# Patient Record
Sex: Female | Born: 1980 | Race: White | Hispanic: Yes | Smoking: Never smoker
Health system: Southern US, Community
[De-identification: ages and names within clinical notes are randomized; demographics above are authoritative.]

## PROBLEM LIST (undated history)

## (undated) DIAGNOSIS — O1490 Unspecified pre-eclampsia, unspecified trimester: Secondary | ICD-10-CM

## (undated) HISTORY — DX: Unspecified pre-eclampsia, unspecified trimester: O14.90

---

## 2005-01-13 ENCOUNTER — Inpatient Hospital Stay (HOSPITAL_COMMUNITY): Admission: AD | Admit: 2005-01-13 | Discharge: 2005-01-13 | Payer: Self-pay | Admitting: *Deleted

## 2005-02-20 ENCOUNTER — Inpatient Hospital Stay (HOSPITAL_COMMUNITY): Admission: AD | Admit: 2005-02-20 | Discharge: 2005-02-20 | Payer: Self-pay | Admitting: Obstetrics & Gynecology

## 2005-04-08 ENCOUNTER — Ambulatory Visit (HOSPITAL_COMMUNITY): Admission: RE | Admit: 2005-04-08 | Discharge: 2005-04-08 | Payer: Self-pay | Admitting: *Deleted

## 2005-06-09 ENCOUNTER — Inpatient Hospital Stay (HOSPITAL_COMMUNITY): Admission: AD | Admit: 2005-06-09 | Discharge: 2005-06-11 | Payer: Self-pay | Admitting: *Deleted

## 2005-06-09 ENCOUNTER — Ambulatory Visit: Payer: Self-pay | Admitting: *Deleted

## 2005-06-19 ENCOUNTER — Ambulatory Visit: Payer: Self-pay | Admitting: Family Medicine

## 2005-07-03 ENCOUNTER — Ambulatory Visit: Payer: Self-pay | Admitting: Family Medicine

## 2005-07-17 ENCOUNTER — Ambulatory Visit: Payer: Self-pay | Admitting: *Deleted

## 2005-07-22 ENCOUNTER — Ambulatory Visit: Payer: Self-pay | Admitting: *Deleted

## 2005-07-29 ENCOUNTER — Ambulatory Visit: Payer: Self-pay | Admitting: *Deleted

## 2005-08-04 DIAGNOSIS — O1490 Unspecified pre-eclampsia, unspecified trimester: Secondary | ICD-10-CM

## 2005-08-04 HISTORY — DX: Unspecified pre-eclampsia, unspecified trimester: O14.90

## 2005-08-05 ENCOUNTER — Ambulatory Visit: Payer: Self-pay | Admitting: *Deleted

## 2005-08-12 ENCOUNTER — Ambulatory Visit: Payer: Self-pay | Admitting: Family Medicine

## 2005-08-14 ENCOUNTER — Ambulatory Visit (HOSPITAL_COMMUNITY): Admission: RE | Admit: 2005-08-14 | Discharge: 2005-08-14 | Payer: Self-pay | Admitting: Obstetrics and Gynecology

## 2005-08-19 ENCOUNTER — Inpatient Hospital Stay (HOSPITAL_COMMUNITY): Admission: AD | Admit: 2005-08-19 | Discharge: 2005-08-22 | Payer: Self-pay | Admitting: *Deleted

## 2005-08-19 ENCOUNTER — Ambulatory Visit: Payer: Self-pay | Admitting: *Deleted

## 2005-08-19 ENCOUNTER — Ambulatory Visit: Payer: Self-pay | Admitting: Family Medicine

## 2007-09-02 ENCOUNTER — Telehealth (INDEPENDENT_AMBULATORY_CARE_PROVIDER_SITE_OTHER): Payer: Self-pay | Admitting: *Deleted

## 2008-02-07 ENCOUNTER — Ambulatory Visit: Payer: Self-pay | Admitting: Internal Medicine

## 2008-02-09 ENCOUNTER — Ambulatory Visit: Payer: Self-pay | Admitting: *Deleted

## 2009-04-18 ENCOUNTER — Ambulatory Visit: Payer: Self-pay | Admitting: Obstetrics and Gynecology

## 2009-04-18 LAB — CONVERTED CEMR LAB
Hepatitis B Surface Ag: NEGATIVE
Hgb A2 Quant: 2.9 % (ref 2.2–3.2)
Hgb A: 97.1 % (ref 96.8–97.8)
Hgb F Quant: 0 % (ref 0.0–2.0)
Lymphocytes Relative: 19 % (ref 12–46)
Lymphs Abs: 1.9 10*3/uL (ref 0.7–4.0)
MCV: 89.7 fL (ref 78.0–100.0)
Monocytes Relative: 6 % (ref 3–12)
Neutro Abs: 7.3 10*3/uL (ref 1.7–7.7)
Neutrophils Relative %: 74 % (ref 43–77)
RBC: 4.65 M/uL (ref 3.87–5.11)
Rubella: 51.1 intl units/mL — ABNORMAL HIGH
WBC: 9.9 10*3/uL (ref 4.0–10.5)

## 2009-04-19 ENCOUNTER — Encounter: Payer: Self-pay | Admitting: Obstetrics and Gynecology

## 2009-04-24 ENCOUNTER — Ambulatory Visit (HOSPITAL_COMMUNITY): Admission: RE | Admit: 2009-04-24 | Discharge: 2009-04-24 | Payer: Self-pay | Admitting: Obstetrics & Gynecology

## 2009-05-16 ENCOUNTER — Ambulatory Visit: Payer: Self-pay | Admitting: Obstetrics and Gynecology

## 2009-06-13 ENCOUNTER — Ambulatory Visit: Payer: Self-pay | Admitting: Obstetrics & Gynecology

## 2009-06-15 ENCOUNTER — Ambulatory Visit (HOSPITAL_COMMUNITY): Admission: RE | Admit: 2009-06-15 | Discharge: 2009-06-15 | Payer: Self-pay | Admitting: Family Medicine

## 2009-07-11 ENCOUNTER — Ambulatory Visit: Payer: Self-pay | Admitting: Obstetrics and Gynecology

## 2009-08-08 ENCOUNTER — Ambulatory Visit: Payer: Self-pay | Admitting: Obstetrics and Gynecology

## 2009-08-22 ENCOUNTER — Encounter: Payer: Self-pay | Admitting: Family

## 2009-08-22 ENCOUNTER — Ambulatory Visit: Payer: Self-pay | Admitting: Obstetrics and Gynecology

## 2009-08-22 LAB — CONVERTED CEMR LAB
HCT: 39 % (ref 36.0–46.0)
Hemoglobin: 12.9 g/dL (ref 12.0–15.0)
MCHC: 33.1 g/dL (ref 30.0–36.0)
Platelets: 294 10*3/uL (ref 150–400)
RBC: 4.36 M/uL (ref 3.87–5.11)
RDW: 13.5 % (ref 11.5–15.5)
WBC: 8.8 10*3/uL (ref 4.0–10.5)

## 2009-09-05 ENCOUNTER — Ambulatory Visit: Payer: Self-pay | Admitting: Obstetrics and Gynecology

## 2009-09-19 ENCOUNTER — Encounter: Payer: Self-pay | Admitting: Advanced Practice Midwife

## 2009-09-19 ENCOUNTER — Ambulatory Visit: Payer: Self-pay | Admitting: Obstetrics and Gynecology

## 2009-09-19 ENCOUNTER — Encounter: Payer: Self-pay | Admitting: Family Medicine

## 2009-09-26 ENCOUNTER — Ambulatory Visit: Payer: Self-pay | Admitting: Obstetrics and Gynecology

## 2009-10-10 ENCOUNTER — Ambulatory Visit: Payer: Self-pay | Admitting: Obstetrics & Gynecology

## 2009-10-17 ENCOUNTER — Ambulatory Visit: Payer: Self-pay | Admitting: Obstetrics and Gynecology

## 2009-10-17 LAB — CONVERTED CEMR LAB
Chlamydia, DNA Probe: NEGATIVE
GC Probe Amp, Genital: NEGATIVE

## 2009-10-18 ENCOUNTER — Encounter: Payer: Self-pay | Admitting: Obstetrics and Gynecology

## 2009-10-18 LAB — CONVERTED CEMR LAB
Clue Cells Wet Prep HPF POC: NONE SEEN
Trich, Wet Prep: NONE SEEN
Yeast Wet Prep HPF POC: NONE SEEN

## 2009-10-24 ENCOUNTER — Ambulatory Visit: Payer: Self-pay | Admitting: Obstetrics and Gynecology

## 2009-10-30 ENCOUNTER — Inpatient Hospital Stay (HOSPITAL_COMMUNITY): Admission: AD | Admit: 2009-10-30 | Discharge: 2009-11-01 | Payer: Self-pay | Admitting: Obstetrics & Gynecology

## 2009-10-30 ENCOUNTER — Ambulatory Visit: Payer: Self-pay | Admitting: Family

## 2009-12-13 ENCOUNTER — Ambulatory Visit: Payer: Self-pay | Admitting: Obstetrics & Gynecology

## 2009-12-13 LAB — CONVERTED CEMR LAB: Pap Smear: NEGATIVE

## 2009-12-25 IMAGING — US US OB COMP LESS 14 WK
1 series · 14 of 28 positions shown · non-contrast
Comparison: none

OBSTETRICAL ULTRASOUND:
 This ultrasound exam was performed in the [HOSPITAL] Ultrasound Department.  The OB US report was generated in the AS system, and faxed to the ordering physician.  This report is also available in [REDACTED] PACS.

[Series 1: us ob comp less 14 wks · 31 acquisitions, 14 frames shown]
[im 2/31]
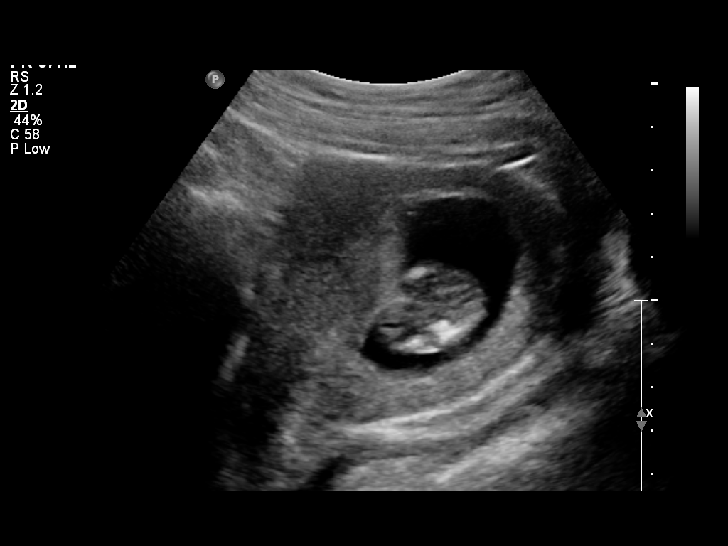
[im 4/31]
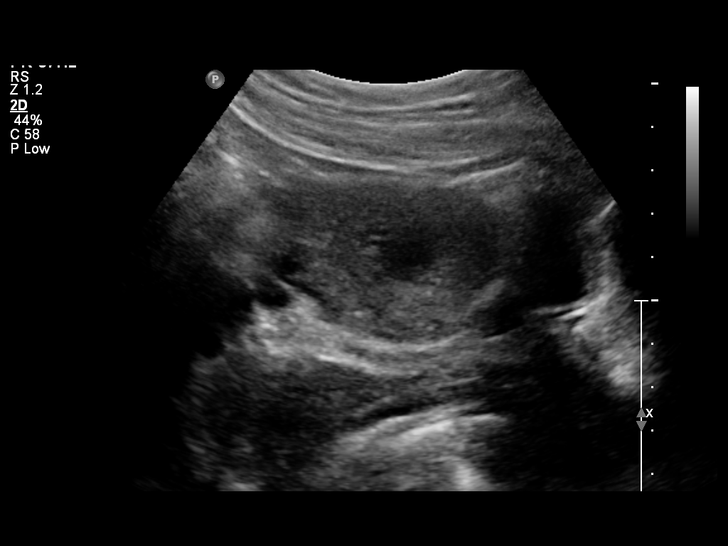
[im 6/31]
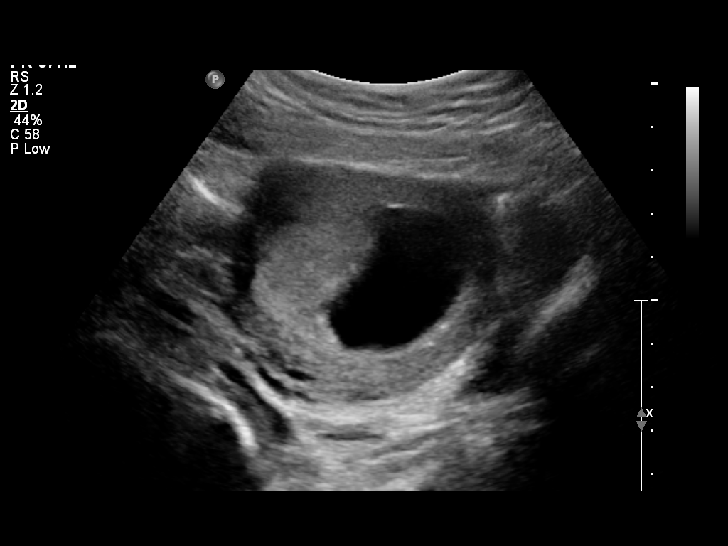
[im 8/31]
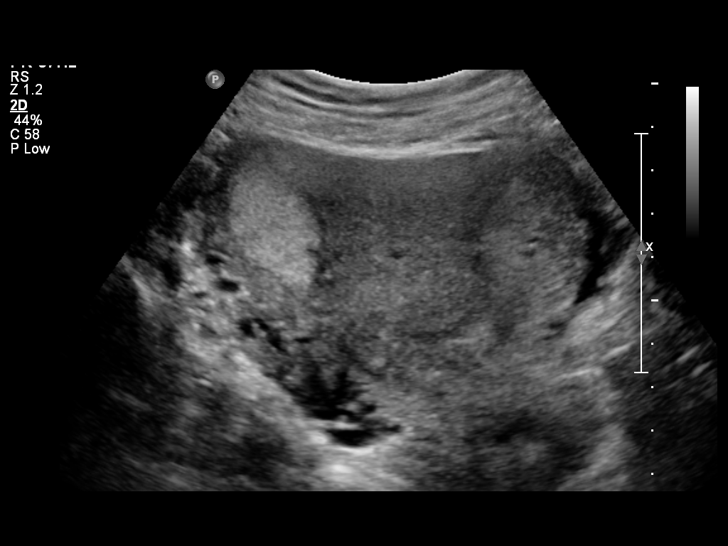
[im 11/31]
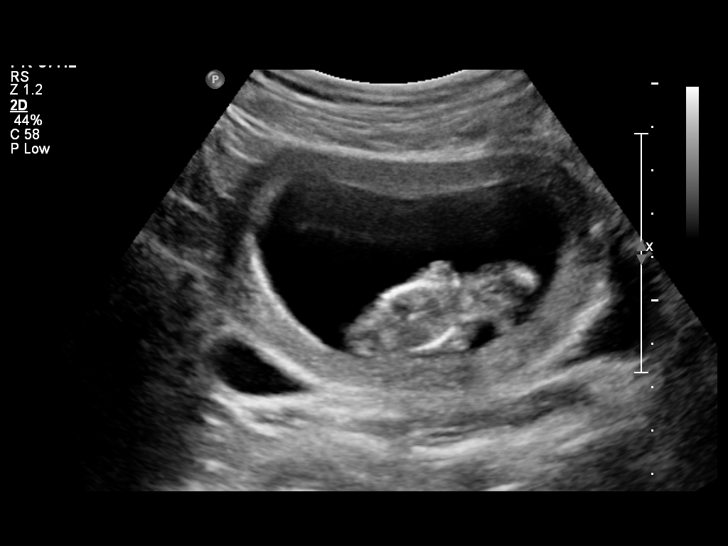
[im 13/31]
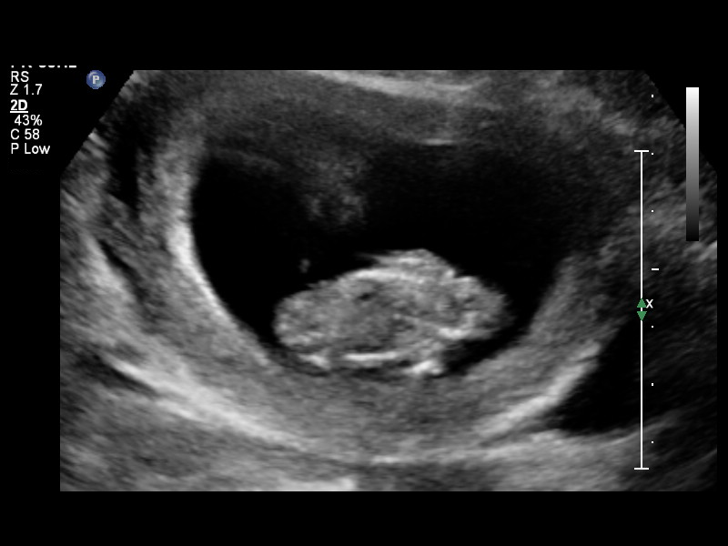
[im 15/31]
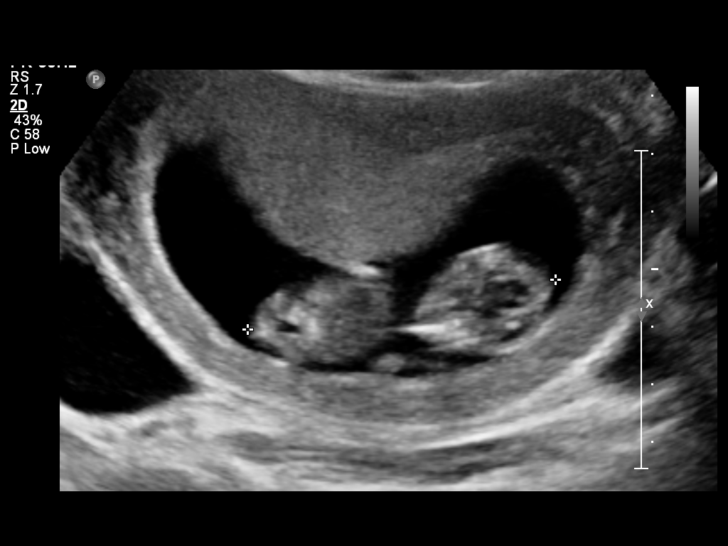
[im 17/31]
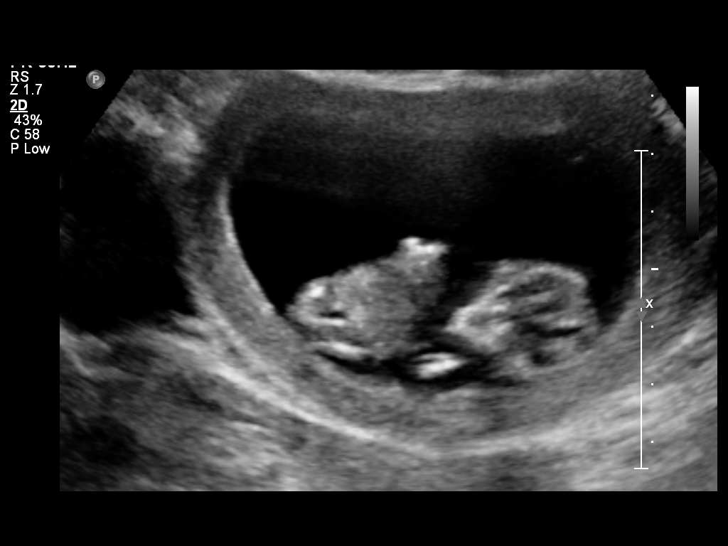
[im 19/31]
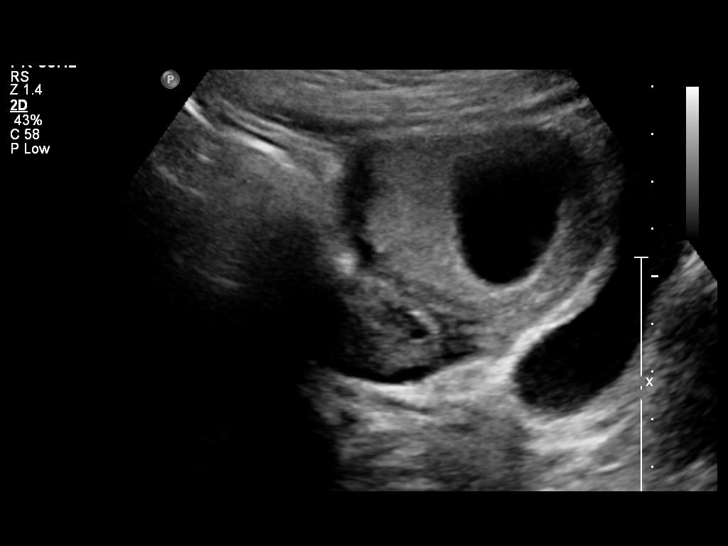
[im 22/31]
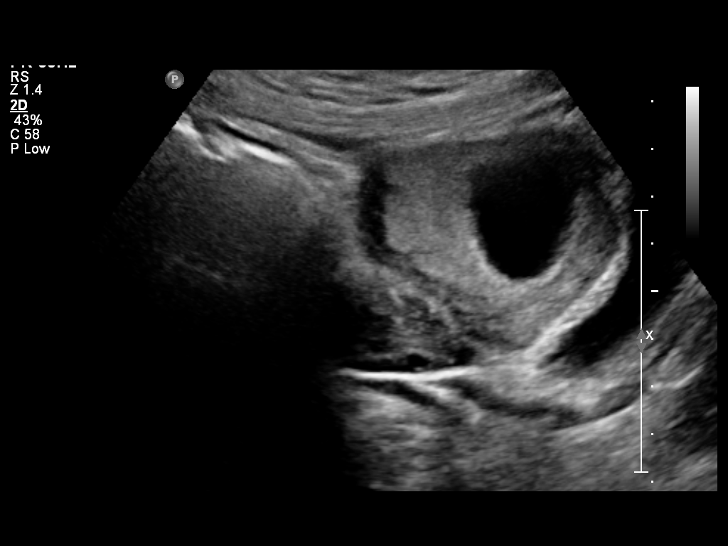
[im 24/31]
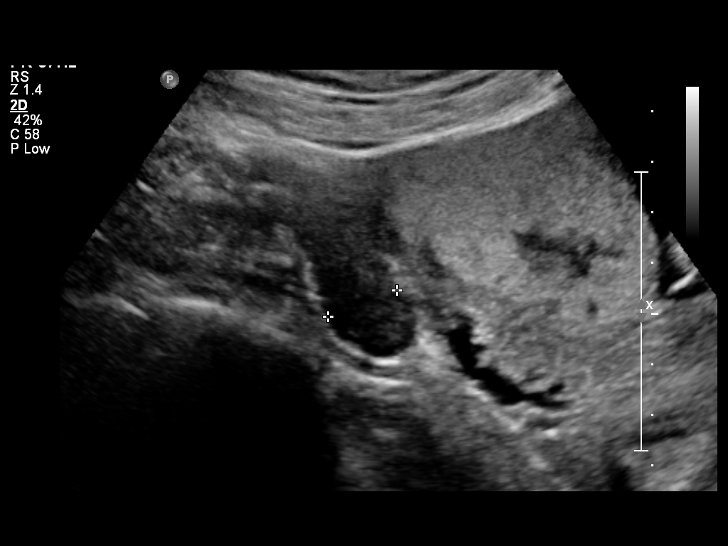
[im 26/31]
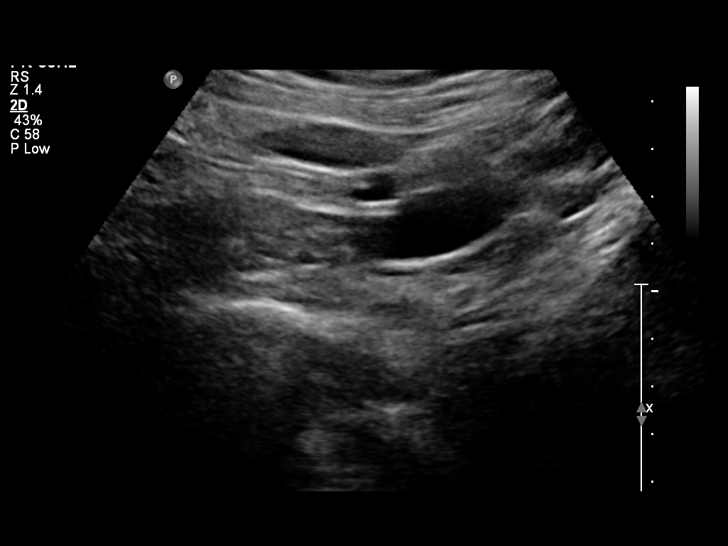
[im 28/31]
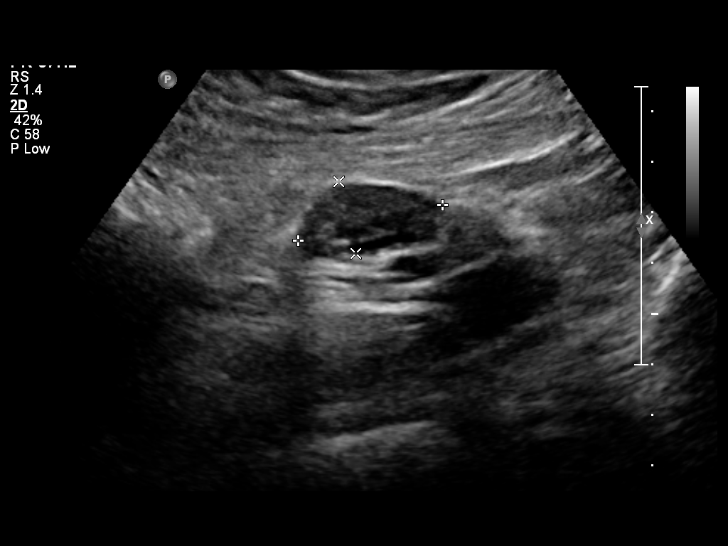
[im 31/31]
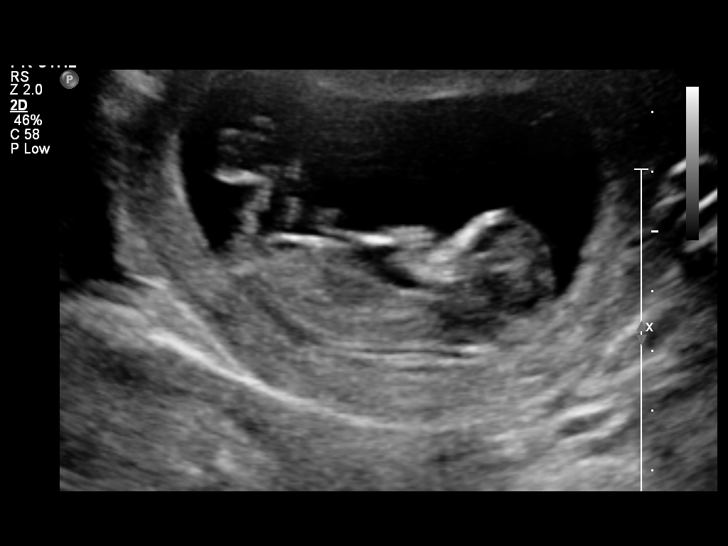

[14 of 28 positions shown; findings below may reference images not displayed]

IMPRESSION: See AS Obstetric US report.

## 2010-02-15 IMAGING — US US OB COMP +14 WK
1 series · 14 of 28 positions shown · non-contrast
Comparison: none

OBSTETRICAL ULTRASOUND:
 This ultrasound exam was performed in the [HOSPITAL] Ultrasound Department.  The OB US report was generated in the AS system, and faxed to the ordering physician.  This report is also available in [HOSPITAL]?s [employer] and in [REDACTED] PACS.

[Series 1: us ob comp +14 wk · 14 of 47 slices shown]
[im 2/47]
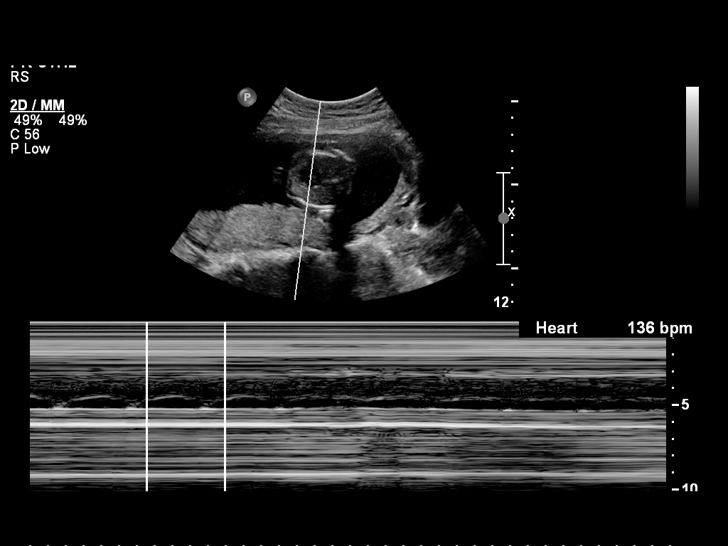
[im 6/47]
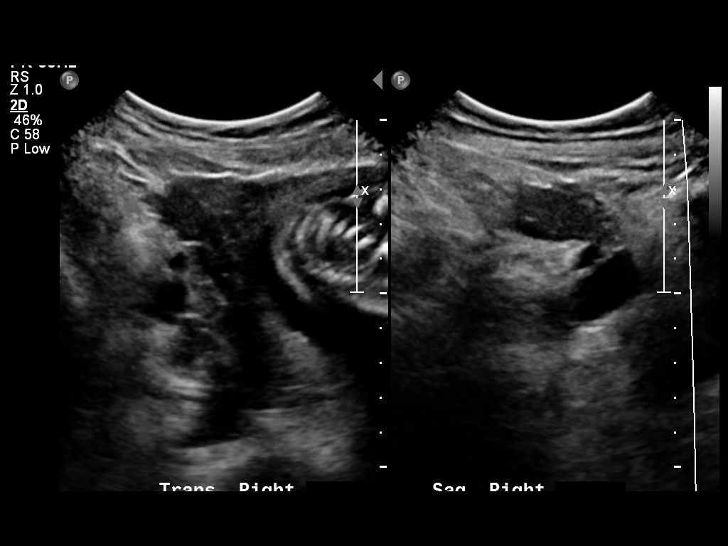
[im 9/47]
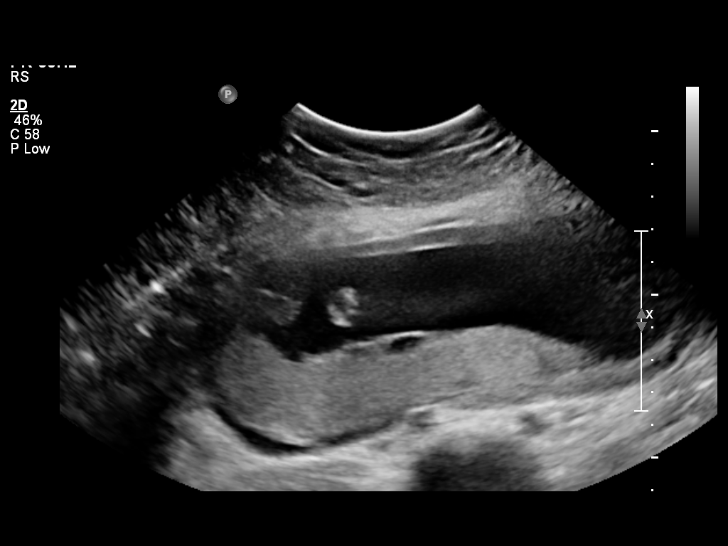
[im 12/47]
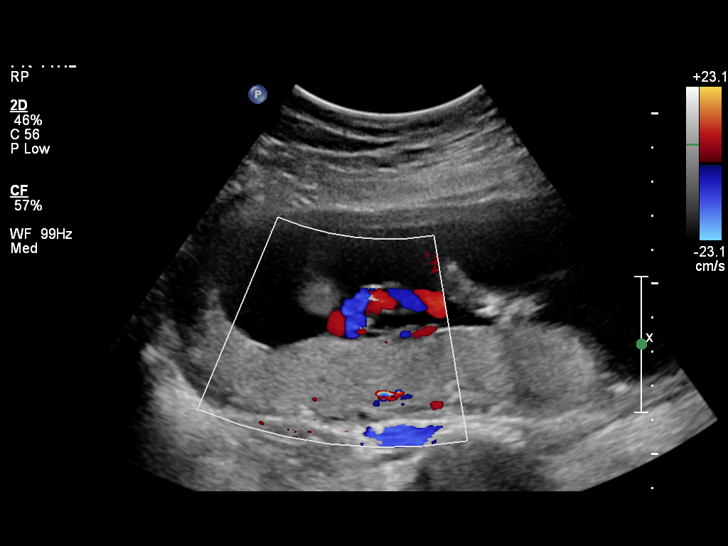
[im 16/47]
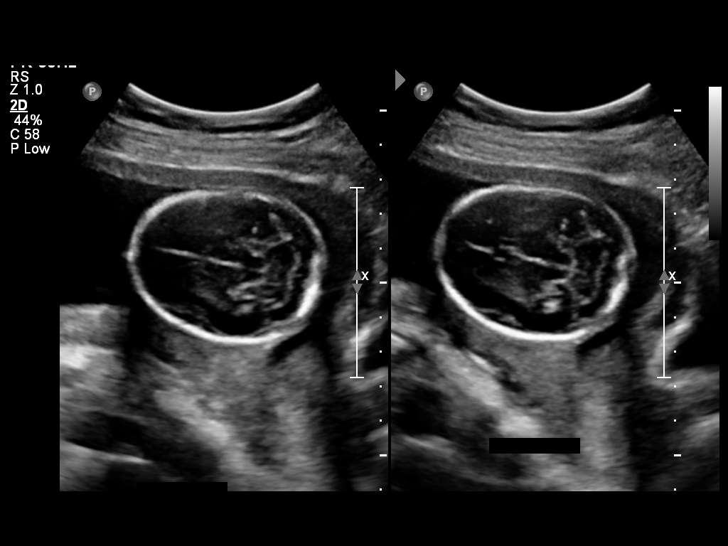
[im 19/47]
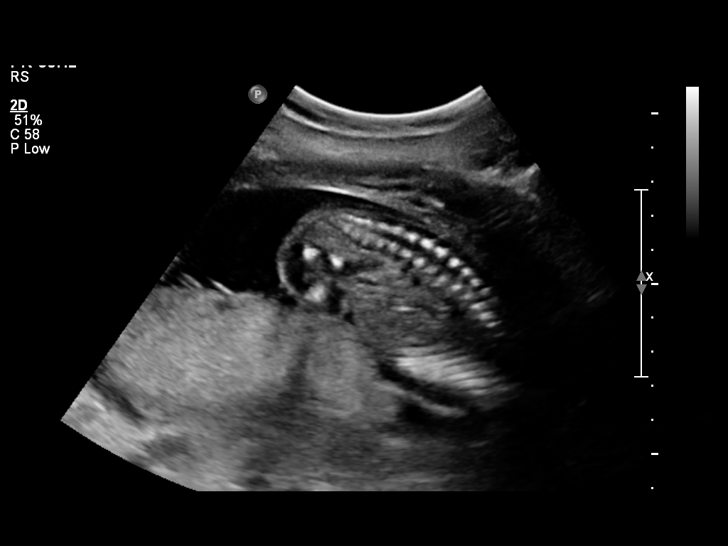
[im 23/47]
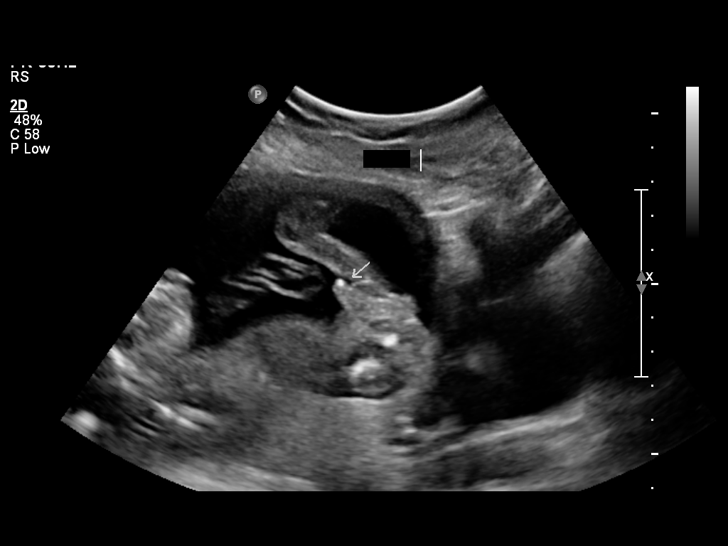
[im 26/47]
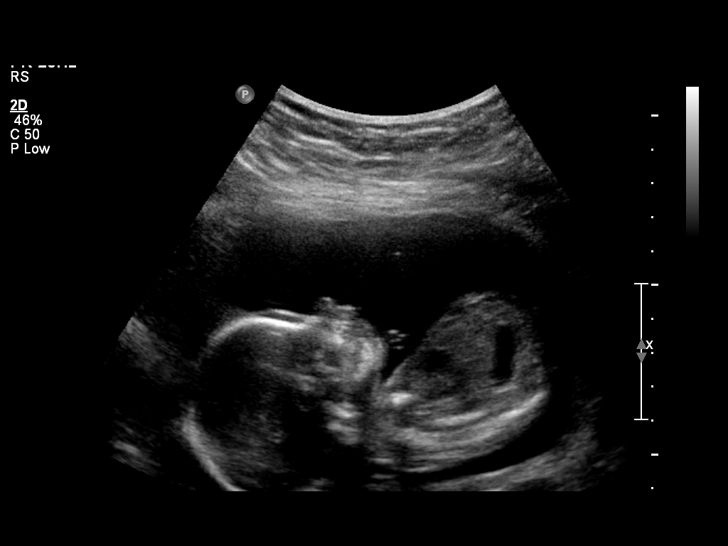
[im 29/47]
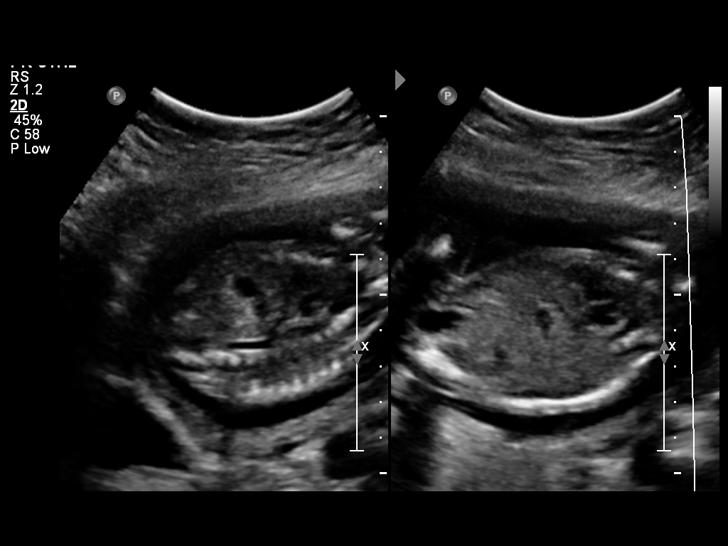
[im 33/47]
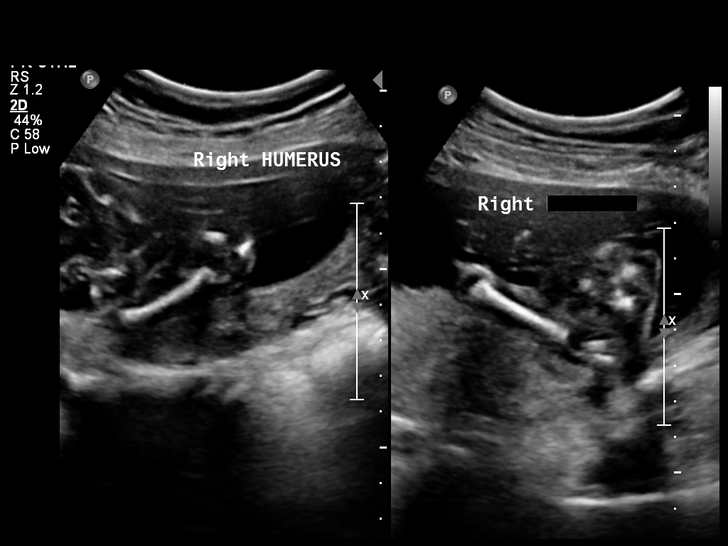
[im 36/47]
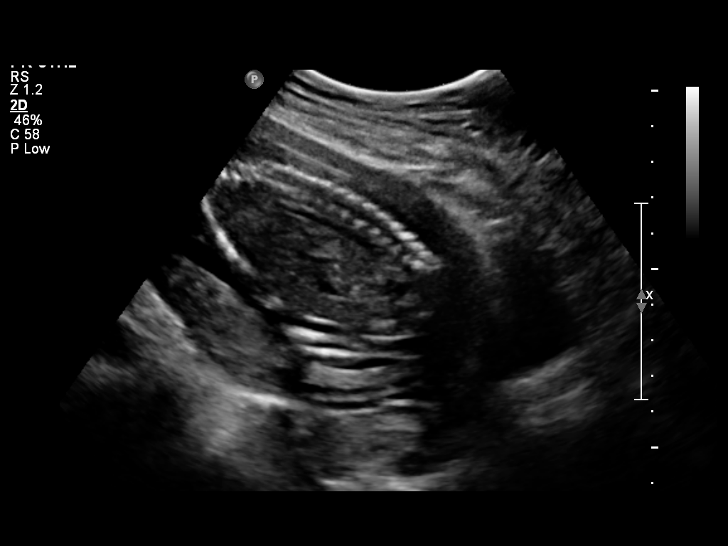
[im 40/47]
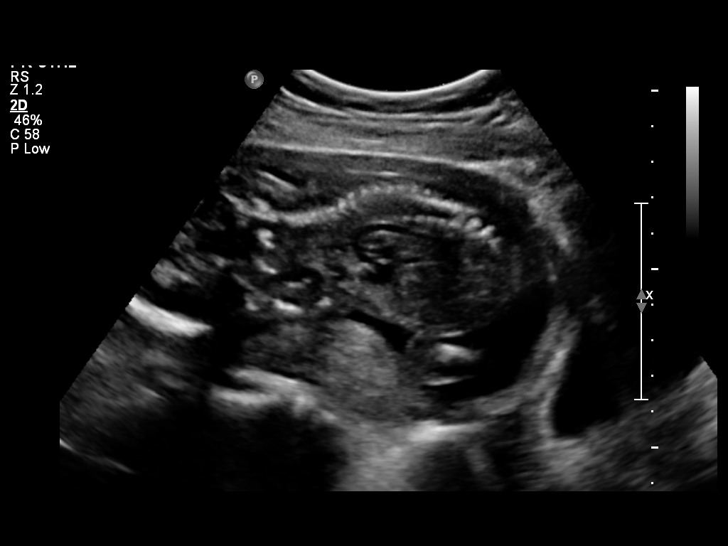
[im 43/47]
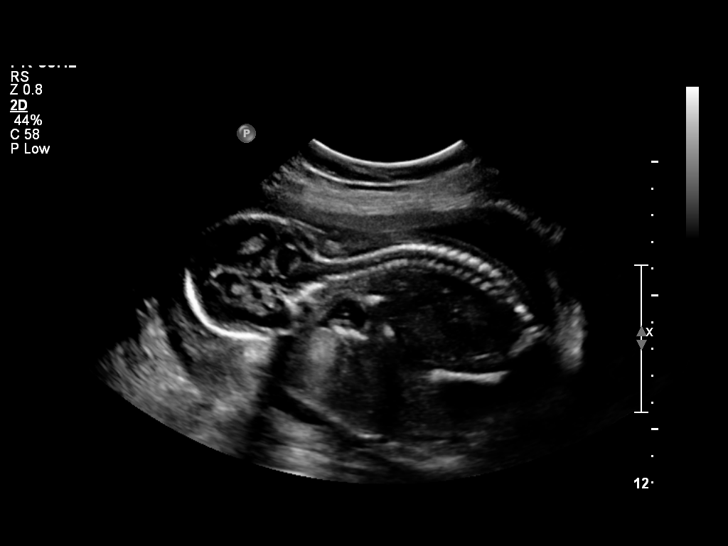
[im 47/47]
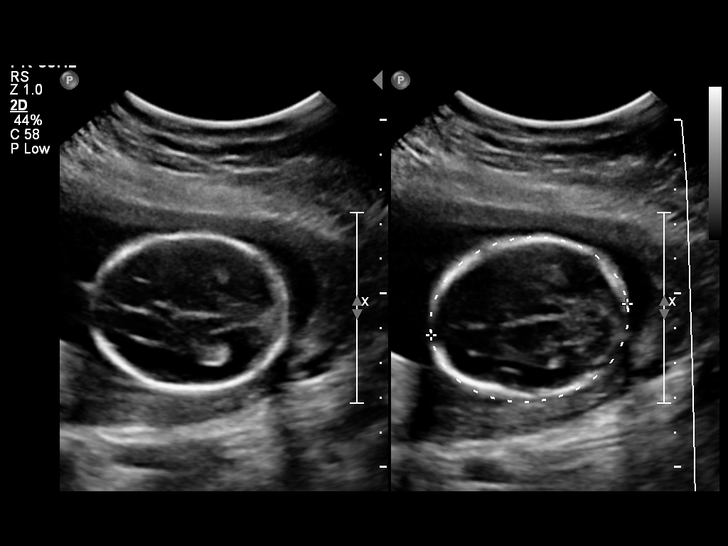

[14 of 28 positions shown; findings below may reference images not displayed]

IMPRESSION: See AS Obstetric US report.

## 2010-07-05 ENCOUNTER — Encounter: Payer: Self-pay | Admitting: Family Medicine

## 2010-07-09 ENCOUNTER — Ambulatory Visit: Payer: Self-pay | Admitting: Family Medicine

## 2010-07-19 ENCOUNTER — Emergency Department (HOSPITAL_COMMUNITY)
Admission: EM | Admit: 2010-07-19 | Discharge: 2010-07-19 | Payer: Self-pay | Source: Home / Self Care | Admitting: Emergency Medicine

## 2010-07-19 ENCOUNTER — Emergency Department (HOSPITAL_COMMUNITY)
Admission: EM | Admit: 2010-07-19 | Discharge: 2010-07-19 | Disposition: A | Discharge status: Other Acute Inpatient Hospital | Payer: Self-pay | Source: Home / Self Care | Admitting: Family Medicine

## 2010-08-06 ENCOUNTER — Ambulatory Visit: Admission: RE | Admit: 2010-08-06 | Discharge: 2010-08-06 | Payer: Self-pay | Source: Home / Self Care

## 2010-08-06 DIAGNOSIS — N898 Other specified noninflammatory disorders of vagina: Secondary | ICD-10-CM | POA: Insufficient documentation

## 2010-08-06 LAB — CONVERTED CEMR LAB
Bilirubin Urine: NEGATIVE
Blood in Urine, dipstick: NEGATIVE
Epithelial cells, urine: >20 /lpf
Glucose, Urine, Semiquant: NEGATIVE
Protein, U semiquant: NEGATIVE
Specific Gravity, Urine: 1.02
Urobilinogen, UA: 0.2
Whiff Test: NEGATIVE
pH: 6.5

## 2010-08-26 ENCOUNTER — Ambulatory Visit
Admission: RE | Admit: 2010-08-26 | Discharge: 2010-08-26 | Payer: Self-pay | Source: Home / Self Care | Attending: Family Medicine | Admitting: Family Medicine

## 2010-08-26 DIAGNOSIS — L03019 Cellulitis of unspecified finger: Secondary | ICD-10-CM | POA: Insufficient documentation

## 2010-09-03 NOTE — Miscellaneous (Signed)
Summary: New Pt Info   Clinical Lists Changes  Observations: Added new observation of FAMILY HX: Mother: colon cancer age 300 Fahter: alive  (07/05/2010 8:51) Added new observation of SOCIAL HX: Lives in [place] with children ([name] 10/30/09, [name] 08/20/05) and fiance [name] 04/12/77).    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    Education: grade 5-8  Tobacco: never Drugs: no Etoh:  no   Allergies (verified): No Known Drug Allergies

## 2010-09-03 NOTE — Assessment & Plan Note (Signed)
Summary: np/mj   Vital Signs:  Patient profile:   30 year old female Weight:      122 pounds Temp:     97.6 degrees F oral Pulse rate:   73 / minute BP sitting:   122 / 71  (left arm) Cuff size:   regular  Vitals Entered By: Tessie Fass CMA (July 09, 2010 10:19 AM) CC: NEW PT    CC:  NEW PT.  History of Present Illness: Shelley Murray presents for New Patient exam.    Delivered 2nd child 10/29/09.  She is breastfeeding exclusively.  Would like to continue breast feeding for 1 year.  She is also donating her milk to mothers who do not have enough breast milk.  She sends her milk to [facility].    Would like to have Mirena for contraception. Right now she is using condoms  LMP 07/01/10, first period after delivery in March 2011.  Lasted 3 days.   She has had Mirena in the past and it worked well for her.  It was removed June-July 2009.  Her only problem with it was that it caused some bloating and hot flashes in the summer time.        Current Medications (verified): 1)  Prenatal Vitamins 0.8 Mg Tabs (Prenatal Multivit-Min-Fe-Fa) .Marland Kitchen.. 1 Tab By Mouth Daily  Allergies (verified): No Known Drug Allergies  Past History:  Family History: Last updated: 07/09/2010 Mother: 52 y/o angina, prediabetes Fahter: healthy Siblings: healthy Breast cancer: none Son: asthma  Social History: Last updated: 07/09/2010 Lives in [place] with children ([name] 10/30/09, [name] 08/20/05) and partner [name] 04/12/77).    Education: grade 5-8  Tobacco: never Drugs: no Etoh:  no  Past Medical History: Murray, SVD: preeclampsia with 1st pregnancy Depression with 1st pregnancy  Family History: Mother: 39 y/o angina, prediabetes Fahter: healthy Siblings: healthy Breast cancer: none Son: asthma  Social History: Lives in [place] with children ([name] 10/30/09, [name] 08/20/05) and partner [name] 04/12/77).    Education: grade  5-8  Tobacco: never Drugs: no Etoh:  no  Review of Systems  The patient denies fever, weight loss, weight gain, hoarseness, chest pain, abdominal pain, melena, severe indigestion/heartburn, hematuria, abnormal bleeding, and breast masses.    Physical Exam  General:  Well-developed,well-nourished,in no acute distress; alert,appropriate and cooperative throughout examination. Vitals reviewed.  Neck:  No deformities, masses, or tenderness noted. Lungs:  Normal respiratory effort, chest expands symmetrically. Lungs are clear to auscultation, no crackles or wheezes. Heart:  Normal rate and regular rhythm. S1 and S2 normal without gallop, murmur, click, rub or other extra sounds. Abdomen:  Bowel sounds positive,abdomen soft and non-tender without masses, organomegaly or hernias noted. Pulses:  +2 bilaterally  Extremities:  No clubbing, cyanosis, edema, or deformity noted with normal full range of motion of all joints.   Neurologic:  alert & oriented X3.   Cervical Nodes:  No lymphadenopathy noted   Impression & Recommendations:  Problem # 1:  MULTIPARITY (ICD-V61.5) Murray  Pt does not desire fertility.  Pt is breastfeeding and I've advised her to continue PNV while breastfeeding.   Problem # 2:  CONTRACEPTIVE MANAGEMENT (ICD-V25.09) Pt is here for Mirena IUD for contraception.  She has had it in the past and has no questions regarding the procedure.  She does not have medical insurance so we will be applying for a scholarship Mirena for her.  Will let pt know when one is available and pt will then make  appt.   Complete Medication List: 1)  Prenatal Vitamins 0.8 Mg Tabs (Prenatal multivit-min-fe-fa) .Marland Kitchen.. 1 tab by mouth daily  Patient Instructions: 1)  Continue taking your prenatal vitamin daily while breast feeding. 2)  I will put you on the waiting list for the Mirena scholarship program.  We will call you when we know the month you will get the Mirena so that you can schedule an  appt.    Orders Added: 1)  [facility]- New Level 3 [99203]      Prevention & Chronic Care Immunizations   Influenza vaccine: Not documented    Tetanus booster: Not documented    Pneumococcal vaccine: Not documented  Other Screening   Pap smear:  Specimen Adequacy: Satisfactory for evaluation.   Interpretation/Result:Negative for intraepithelial Lesion or Malignancy.     (12/13/2009)   Pap smear due: 12/2011   Smoking status: Not documented    Pap Smear  Procedure date:  12/13/2009  Findings:       Specimen Adequacy: Satisfactory for evaluation.   Interpretation/Result:Negative for intraepithelial Lesion or Malignancy.     Procedures Next Due Date:    Pap Smear: 12/2011

## 2010-09-05 NOTE — Assessment & Plan Note (Signed)
Summary: Dysuria    Vital Signs:  Patient profile:   30 year old female Weight:      122 pounds Temp:     98.3 degrees F oral Pulse rate:   67 / minute BP sitting:   101 / 63  (left arm) Cuff size:   regular  Vitals Entered By: Tessie Fass CMA (August 06, 2010 8:48 AM) CC: dysuria   Primary Care Provider:  Gunnar Hereford MD  CC:  dysuria.  History of Present Illness: Interpretor: Marines Jean Rosenthal  30 y/o previously healthy F is here for dysuria  DYSURIA Onset:  3 wks ago Description: painful urination, abd pain Modifying factors: 3 wks ago pt thought that she had an infection and went to ER.  UPT neg, UA negative, but had leukocytosis with WBC 14s.   Thinks that symptoms occurred after she had sex with husband.  Did not use spermicide or gels.  Wonders if she is allergic to latex, but states that she has had no problems with latex gloves previously when she delivered her children.    Symptoms Urgency:  yes Frequency:  yes Hesitancy: yes Hematuria:  no Flank Pain:  yes Fever: no Nausea/Vomiting:  no Missed LMP: no, LMP 07/29/10 STD exposure: no Discharge: no Irritants: no Rash: no  Red Flags   More than 3 UTI's last 12 months:  no PMH of  Diabetes or Immunosuppression:  no Renal Disease/Calculi: no Urinary Tract Abnormality:  no Instrumentation or Trauma: no    Current Medications (verified): 1)  Prenatal Vitamins 0.8 Mg Tabs (Prenatal Multivit-Min-Fe-Fa) .Marland Kitchen.. 1 Tab By Mouth Daily  Allergies (verified): No Known Drug Allergies  Past History:  Past Medical History: Last updated: 07/09/2010 G2P2002, SVD: preeclampsia with 1st pregnancy Depression with 1st pregnancy  Family History: Last updated: 07/09/2010 Mother: 62 y/o angina, prediabetes Fahter: healthy Siblings: healthy Breast cancer: none Son: asthma  Social History: Last updated: 07/09/2010 Lives in [place] with children ([name] 10/30/09, [name] 08/20/05) and partner [name] 04/12/77).    Education: grade 5-8  Tobacco: never Drugs: no Etoh:  no  Review of Systems       per hpi   Physical Exam  General:  Well-developed,well-nourished,in no acute distress; alert,appropriate and cooperative throughout examination. Vitals reviewed.  Abdomen:  Bowel sounds positive,abdomen soft and non-tender without masses, organomegaly or hernias noted. Genitalia:  Normal introitus for age, no external lesions, positive  vaginal discharge, mucosa pink and moist, no vaginal or cervical lesions, no vaginal atrophy, no friaility or hemorrhage, normal uterus size and position, no adnexal masses or tenderness Inguinal Nodes:  No significant adenopathy   Impression & Recommendations:  Problem # 1:  DYSURIA (ICD-788.1) Assessment New  UPT neg on 07/19/10 when pt went to ER.  LMP 07/29/10.  She was seeen in ER 07/19/10 and had leukocytosis.  She has been having symptoms x 3 wks.  Wet prep negative.  UA had small leuks, no nitrites, many epith.  Will treat with Keflex 500mg   (safe for breast milk as pt is breast feeding) three times a day x 5 days.  Pt to rtc next week if still symptomatic.   Orders: [facility]- Est Level  3 (04540)  Her updated medication list for this problem includes:    Keflex 500 Mg Caps (Cephalexin) .Marland Kitchen... 1 tab by mouth three times a day x 5 days  Complete Medication List: 1)  Prenatal Vitamins 0.8 Mg Tabs (Prenatal multivit-min-fe-fa) .Marland Kitchen.. 1 tab by mouth daily 2)  Keflex 500 Mg Caps (Cephalexin) .Marland Kitchen.. 1 tab by mouth three times a day x 5 days  Other Orders: [facility] (00000) [facility] Prescriptions: KEFLEX 500 MG CAPS (CEPHALEXIN) 1 tab by mouth three times a day x 5 days  #15 x 0   Entered and Authorized by:   Angeline Slim MD   Signed by:   Angeline Slim MD on 08/06/2010   Method used:   Electronically to        [facility] [address] (retail)       [phone]       [place], [place]  [address]       Ph:  [phone]       Fax: [phone]   RxID:   [id number]    Orders Added: 1)  Urinalysis-[facility] [00000] 2)  Wet Prep- [facility] [87210] 3)  [facility]- Est Level  3 [66440]    Laboratory Results   Urine Tests  Date/Time Received: August 06, 2010 9:00 AM  Date/Time Reported: August 06, 2010 9:49 AM   Routine Urinalysis   Color: straw Appearance: Clear Glucose: negative   (Normal Range: Negative) Bilirubin: negative   (Normal Range: Negative) Ketone: negative   (Normal Range: Negative) Spec. Gravity: 1.020   (Normal Range: 1.003-1.035) Blood: negative   (Normal Range: Negative) pH: 6.5   (Normal Range: 5.0-8.0) Protein: negative   (Normal Range: Negative) Urobilinogen: 0.2   (Normal Range: 0-1) Nitrite: negative   (Normal Range: Negative) Leukocyte Esterace: small   (Normal Range: Negative)  Urine Microscopic WBC/HPF: 10-20 RBC/HPF: 0-3 Bacteria/HPF: 2+ Epithelial/HPF: >20    Comments: ...............test performed by......Marland KitchenBonnie A. [place], MLS (ASCP)cm  Date/Time Received: August 06, 2010 9:00 AM  Date/Time Reported: August 06, 2010 9:48 AM   [employer] Source: vag WBC/hpf: >20 Bacteria/hpf: 2+  Rods Clue cells/hpf: none  Negative whiff Yeast/hpf: none Trichomonas/hpf: none Comments: ...............test performed by......Marland KitchenBonnie A. [place], MLS (ASCP)cm

## 2010-09-05 NOTE — Assessment & Plan Note (Signed)
Summary: finger infection.ls   Vital Signs:  Patient profile:   30 year old female Weight:      125 pounds Temp:     97.8 degrees F Pulse rate:   72 / minute BP sitting:   102 / 68  Vitals Entered By: Jone Baseman CMA (August 26, 2010 2:48 PM) CC: finger infection x 2 weeks Is Patient Diabetic? No Pain Assessment Patient in pain? yes     Location: right thumb Intensity: 2   Primary Care Provider:  Cat Ta MD  CC:  finger infection x 2 weeks.  History of Present Illness: 1) Right thumb infection: x 2 weeks. Got a small cut at her right lateral nail fold two weeks ago.  Mild pain and moderate swelling and redness over the past two weeks. Has drained some pus yesterday and today. Does not bite nails.   ROS:  Denies numbness, tingling, fever, chills, nausea, emesis  Reports that she is breast feeding   Med rec = prenatal vitamin daily     Habits & Providers  Alcohol-Tobacco-Diet     Tobacco Status: never  Allergies (verified): No Known Drug Allergies  Social History: Smoking Status:  never  Physical Exam  General:  Well-developed,well-nourished,in no acute distress; alert,appropriate and cooperative throughout examination. Vitals reviewed.  Skin:  right thumb swelling and redness at lateral and proximal nail fold w/ mild purulent discharge easily expressible.    Impression & Recommendations:  Problem # 1:  PARONYCHIA, FINGER (ICD-681.02) Assessment New  Right thumb. Not concerned for oral flora. Advised patient to stop picking at finger to prevent re-infection. Will treat with Keflex since breast feeding. Advised regarding warm water soaks two times a day. Advised to follow with Dr. Janalyn Harder in one week if not improving. Red flags that would prompt return to care were reviewed with patient and patient expressed understanding.   Her updated medication list for this problem includes:    Keflex 500 Mg Caps (Cephalexin) .Marland Kitchen... 1 tab by mouth three times a day x 5  days  Orders: [facility]- Est Level  3 (16109)  Complete Medication List: 1)  Prenatal Vitamins 0.8 Mg Tabs (Prenatal multivit-min-fe-fa) .Marland Kitchen.. 1 tab by mouth daily 2)  Keflex 500 Mg Caps (Cephalexin) .Marland Kitchen.. 1 tab by mouth three times a day x 5 days Prescriptions: KEFLEX 500 MG CAPS (CEPHALEXIN) 1 tab by mouth three times a day x 5 days  #15 x 0   Entered and Authorized by:   Bobby Rumpf  MD   Signed by:   Bobby Rumpf  MD on 08/26/2010   Method used:   Electronically to        [facility] [address] [id number] (retail)       [address] [place], [place]  [address]       Ph: [phone]       Fax: [phone]   RxID:   [id number]    Orders Added: 1)  [facility]- Est Level  3 [27253]

## 2010-09-23 ENCOUNTER — Ambulatory Visit (INDEPENDENT_AMBULATORY_CARE_PROVIDER_SITE_OTHER): Payer: Self-pay | Admitting: Family Medicine

## 2010-09-23 ENCOUNTER — Encounter: Payer: Self-pay | Admitting: Family Medicine

## 2010-09-23 VITALS — BP 112/69 | HR 70 | Temp 97.6°F | Wt 126.0 lb

## 2010-09-23 DIAGNOSIS — L03019 Cellulitis of unspecified finger: Secondary | ICD-10-CM

## 2010-09-23 MED ORDER — CLINDAMYCIN HCL 300 MG PO CAPS: 300.0000 mg | ORAL_CAPSULE | Freq: Three times a day (TID) | ORAL | Status: DC

## 2010-09-23 NOTE — Progress Notes (Signed)
  Subjective:    Patient ID: Shelley Murray, female    DOB: 11-28-1980, 30 y.o.   MRN: [id number]  HPI 30 yo breastfeeding female with R thumb pain.  See last month for this, dx paronychia, tx with keflex.  Symptoms improved almost completely.  Now has had similar symptoms for the past few days, no trauma, doesn't bite nails or suck thumb.  Noone else uses her nail clippers.  No fevers/chills.  Thumb was draining but now has stopped.  Not doing any hot water soaks or local wound care.  Pain minimal.   Review of Systems    Neg except as in HPI Objective:   Physical Exam  Constitutional: She appears well-developed and well-nourished. No distress.  Skin: Skin is warm and dry.       R thumb with minimal erythema along medial nail fold.  No drainage, no fluctuance, no purulence, no induration, no eczematous changes.  Nail plate separated from nail bed to approx 1 mm from the medial nail fold.  No other skin changes.  Contralateral hand unremarkable.  No signs ingrown nail.            Assessment & Plan:

## 2010-09-23 NOTE — Assessment & Plan Note (Signed)
Soak in epsom salts. Keep hands clean and dry. Will tx with clindamycin to cover S. Aureus, pneumococcus, as well as oral anaerobes for 10 days. RTC if no better after 10d. If no improvement would consider fungal/dishydrotic eczema/chronic paronychia.

## 2010-09-23 NOTE — Patient Instructions (Signed)
Great to see you. See handout. Clinidamycin. Come back as needed. -Dr. Karie Schwalbe.

## 2010-10-09 ENCOUNTER — Ambulatory Visit (INDEPENDENT_AMBULATORY_CARE_PROVIDER_SITE_OTHER): Payer: Self-pay | Admitting: Family Medicine

## 2010-10-09 ENCOUNTER — Encounter: Payer: Self-pay | Admitting: Family Medicine

## 2010-10-09 VITALS — BP 92/58 | Temp 98.1°F | Ht 61.5 in | Wt 125.0 lb

## 2010-10-09 DIAGNOSIS — L03019 Cellulitis of unspecified finger: Secondary | ICD-10-CM

## 2010-10-09 MED ORDER — MUPIROCIN 2 % EX OINT: TOPICAL_OINTMENT | CUTANEOUS | Status: AC

## 2010-10-09 MED ORDER — TRIAMCINOLONE ACETONIDE 0.5 % EX OINT: TOPICAL_OINTMENT | Freq: Two times a day (BID) | CUTANEOUS | Status: AC

## 2010-10-09 NOTE — Assessment & Plan Note (Addendum)
Discussed with preceptor, since some pus/drainage reported from pt will have pt treat with bactroban x 2 weeks, then after 2 weeks will treat with high dose topical steroid to area (which is recommended treatment for chronic paronychia per uptodate).  Pt to return in 1 month.

## 2010-10-09 NOTE — Progress Notes (Signed)
  Subjective:    Patient ID: Shelley Murray, female    DOB: 1980/09/28, 30 y.o.   MRN: [id number]  HPI Pt states that she continues to have pain in her right thumb.  She states that the first antibiotic (keflex) she was given was helped improve it only a little, the second antiobiotic (clindamycin) did not help improve it at all.  Pt states that her thumb continues to hurt and that sometimes pus will come out of medial nail bed area.  Pt states that the pus has a foul odor.   Review of Systems No fever.  No redness in hand or other extremitity. No body aches. No chills.    Objective:   Physical Exam  Constitutional: She appears well-developed and well-nourished.  Musculoskeletal:       Arms:         Assessment & Plan:

## 2010-10-09 NOTE — Patient Instructions (Addendum)
regresa en 1 mes para otra examinacion. regrasa mas pronto si los sintomas son peorando. [place] los creams como indicado.  Bactroban x 2 weeks and triamcinolone x 2 weeks.

## 2010-10-14 LAB — POCT URINALYSIS DIPSTICK
Bilirubin Urine: NEGATIVE
Glucose, UA: NEGATIVE mg/dL
Ketones, ur: NEGATIVE mg/dL
Nitrite: NEGATIVE
Protein, ur: 30 mg/dL — AB
Specific Gravity, Urine: 1.025 (ref 1.005–1.030)
Urobilinogen, UA: 0.2 mg/dL (ref 0.0–1.0)
pH: 5.5 (ref 5.0–8.0)

## 2010-10-14 LAB — CBC
HCT: 37.7 % (ref 36.0–46.0)
Hemoglobin: 12.7 g/dL (ref 12.0–15.0)
MCH: 30 pg (ref 26.0–34.0)
MCHC: 33.7 g/dL (ref 30.0–36.0)
MCV: 89.1 fL (ref 78.0–100.0)
Platelets: 268 10*3/uL (ref 150–400)
RBC: 4.23 MIL/uL (ref 3.87–5.11)
RDW: 13.5 % (ref 11.5–15.5)
WBC: 14.9 10*3/uL — ABNORMAL HIGH (ref 4.0–10.5)

## 2010-10-14 LAB — COMPREHENSIVE METABOLIC PANEL
ALT: 14 U/L (ref 0–35)
AST: 17 U/L (ref 0–37)
Albumin: 3.8 g/dL (ref 3.5–5.2)
Alkaline Phosphatase: 108 U/L (ref 39–117)
BUN: 11 mg/dL (ref 6–23)
CO2: 22 mEq/L (ref 19–32)
Calcium: 8.2 mg/dL — ABNORMAL LOW (ref 8.4–10.5)
Chloride: 99 mEq/L (ref 96–112)
Creatinine, Ser: 0.6 mg/dL (ref 0.4–1.2)
GFR calc Af Amer: >60 mL/min (ref >60)
GFR calc non Af Amer: >60 mL/min (ref >60)
Glucose, Bld: 168 mg/dL — ABNORMAL HIGH (ref 70–99)
Potassium: 3.4 mEq/L — ABNORMAL LOW (ref 3.5–5.1)
Sodium: 131 mEq/L — ABNORMAL LOW (ref 135–145)
Total Bilirubin: 0.8 mg/dL (ref 0.3–1.2)
Total Protein: 7.3 g/dL (ref 6.0–8.3)

## 2010-10-14 LAB — URINALYSIS, ROUTINE W REFLEX MICROSCOPIC
Bilirubin Urine: NEGATIVE
Glucose, UA: NEGATIVE mg/dL
Hgb urine dipstick: NEGATIVE
Ketones, ur: >80 mg/dL — AB
Nitrite: NEGATIVE
Protein, ur: NEGATIVE mg/dL
Specific Gravity, Urine: 1.026 (ref 1.005–1.030)
Urobilinogen, UA: 0.2 mg/dL (ref 0.0–1.0)
pH: 6 (ref 5.0–8.0)

## 2010-10-14 LAB — DIFFERENTIAL
Basophils Absolute: 0 10*3/uL (ref 0.0–0.1)
Basophils Relative: 0 % (ref 0–1)
Eosinophils Absolute: 0 10*3/uL (ref 0.0–0.7)
Eosinophils Relative: 0 % (ref 0–5)
Lymphocytes Relative: 14 % (ref 12–46)
Lymphs Abs: 2.1 10*3/uL (ref 0.7–4.0)
Monocytes Absolute: 1.2 10*3/uL — ABNORMAL HIGH (ref 0.1–1.0)
Monocytes Relative: 8 % (ref 3–12)
Neutro Abs: 11.6 10*3/uL — ABNORMAL HIGH (ref 1.7–7.7)
Neutrophils Relative %: 78 % — ABNORMAL HIGH (ref 43–77)

## 2010-10-14 LAB — URINE MICROSCOPIC-ADD ON

## 2010-10-14 LAB — POCT PREGNANCY, URINE
Preg Test, Ur: NEGATIVE
Preg Test, Ur: NEGATIVE

## 2010-10-20 LAB — POCT URINALYSIS DIP (DEVICE)
Bilirubin Urine: NEGATIVE
Bilirubin Urine: NEGATIVE
Glucose, UA: NEGATIVE mg/dL
Glucose, UA: NEGATIVE mg/dL
Hgb urine dipstick: NEGATIVE
Hgb urine dipstick: NEGATIVE
Ketones, ur: NEGATIVE mg/dL
Ketones, ur: NEGATIVE mg/dL
Nitrite: NEGATIVE
Nitrite: NEGATIVE
Protein, ur: NEGATIVE mg/dL
Protein, ur: 30 mg/dL — AB
Specific Gravity, Urine: 1.015 (ref 1.005–1.030)
Specific Gravity, Urine: 1.015 (ref 1.005–1.030)
Urobilinogen, UA: 0.2 mg/dL (ref 0.0–1.0)
Urobilinogen, UA: 0.2 mg/dL (ref 0.0–1.0)
pH: 8.5 — ABNORMAL HIGH (ref 5.0–8.0)
pH: 8.5 — ABNORMAL HIGH (ref 5.0–8.0)

## 2010-10-23 LAB — POCT URINALYSIS DIP (DEVICE)
Bilirubin Urine: NEGATIVE
Bilirubin Urine: NEGATIVE
Bilirubin Urine: NEGATIVE
Glucose, UA: NEGATIVE mg/dL
Glucose, UA: NEGATIVE mg/dL
Hgb urine dipstick: NEGATIVE
Hgb urine dipstick: NEGATIVE
Ketones, ur: NEGATIVE mg/dL
Ketones, ur: NEGATIVE mg/dL
Nitrite: NEGATIVE
Nitrite: NEGATIVE
Protein, ur: 30 mg/dL — AB
Protein, ur: 30 mg/dL — AB
Specific Gravity, Urine: 1.01 (ref 1.005–1.030)
Specific Gravity, Urine: 1.015 (ref 1.005–1.030)
Urobilinogen, UA: 0.2 mg/dL (ref 0.0–1.0)
pH: 5.5 (ref 5.0–8.0)
pH: 7 (ref 5.0–8.0)
pH: 8.5 — ABNORMAL HIGH (ref 5.0–8.0)

## 2010-10-27 LAB — CBC
Hemoglobin: 14.1 g/dL (ref 12.0–15.0)
MCHC: 33.3 g/dL (ref 30.0–36.0)
Platelets: 232 10*3/uL (ref 150–400)
RDW: 14.6 % (ref 11.5–15.5)

## 2010-10-27 LAB — POCT URINALYSIS DIP (DEVICE)
Bilirubin Urine: NEGATIVE
Hgb urine dipstick: NEGATIVE
Hgb urine dipstick: NEGATIVE
Hgb urine dipstick: NEGATIVE
Ketones, ur: NEGATIVE mg/dL
Nitrite: NEGATIVE
Nitrite: NEGATIVE
Protein, ur: NEGATIVE mg/dL
Protein, ur: NEGATIVE mg/dL
Protein, ur: NEGATIVE mg/dL
Specific Gravity, Urine: 1.015 (ref 1.005–1.030)
Specific Gravity, Urine: 1.025 (ref 1.005–1.030)
Urobilinogen, UA: 0.2 mg/dL (ref 0.0–1.0)
Urobilinogen, UA: 0.2 mg/dL (ref 0.0–1.0)
Urobilinogen, UA: 0.2 mg/dL (ref 0.0–1.0)
pH: 5 (ref 5.0–8.0)
pH: 5.5 (ref 5.0–8.0)
pH: 7.5 (ref 5.0–8.0)

## 2010-11-05 LAB — POCT URINALYSIS DIP (DEVICE)
Glucose, UA: NEGATIVE mg/dL
Hgb urine dipstick: NEGATIVE
Nitrite: NEGATIVE
Urobilinogen, UA: 0.2 mg/dL (ref 0.0–1.0)
pH: 8.5 — ABNORMAL HIGH (ref 5.0–8.0)

## 2010-11-06 LAB — POCT URINALYSIS DIP (DEVICE)
Bilirubin Urine: NEGATIVE
Nitrite: NEGATIVE
Protein, ur: 30 mg/dL — AB
pH: 8.5 — ABNORMAL HIGH (ref 5.0–8.0)

## 2010-11-07 ENCOUNTER — Encounter: Payer: Self-pay | Admitting: Family Medicine

## 2010-11-07 ENCOUNTER — Ambulatory Visit (INDEPENDENT_AMBULATORY_CARE_PROVIDER_SITE_OTHER): Payer: Self-pay | Admitting: Family Medicine

## 2010-11-07 DIAGNOSIS — Z309 Encounter for contraceptive management, unspecified: Secondary | ICD-10-CM

## 2010-11-07 DIAGNOSIS — Z975 Presence of (intrauterine) contraceptive device: Secondary | ICD-10-CM | POA: Insufficient documentation

## 2010-11-07 HISTORY — PX: OTHER SURGICAL HISTORY: SHX169

## 2010-11-07 LAB — POCT URINALYSIS DIP (DEVICE)
Bilirubin Urine: NEGATIVE
Glucose, UA: NEGATIVE mg/dL
Hgb urine dipstick: NEGATIVE
Ketones, ur: NEGATIVE mg/dL
Nitrite: NEGATIVE
Protein, ur: NEGATIVE mg/dL
Specific Gravity, Urine: 1.015 (ref 1.005–1.030)
Urobilinogen, UA: 0.2 mg/dL (ref 0.0–1.0)
pH: 8.5 — ABNORMAL HIGH (ref 5.0–8.0)

## 2010-11-07 LAB — POCT URINE PREGNANCY: Preg Test, Ur: NEGATIVE

## 2010-11-07 MED ORDER — IBUPROFEN 200 MG PO TABS
400.0000 mg | ORAL_TABLET | Freq: Once | ORAL | Status: AC
  Administered 2010-11-07: 400 mg via ORAL

## 2010-11-07 MED ORDER — LEVONORGESTREL 20 MCG/24HR IU IUD
1.0000 | INTRAUTERINE_SYSTEM | Freq: Once | INTRAUTERINE | Status: DC
Start: 2010-11-07 — End: 2016-06-16

## 2010-11-07 NOTE — Assessment & Plan Note (Signed)
Consent signed.  Mirena placed.  Pt has no questions.

## 2010-11-07 NOTE — Progress Notes (Signed)
  Subjective:    Patient ID: Shelley Murray, female    DOB: 1980/10/15, 30 y.o.   MRN: [id number]  HPI Exam was done with existence of interpretor Marines [place].   Pt is here for IUD insertion.  I have seen pt in the past and have gone over instructions with her.  She also has had a Mirena in the past and has no questions today.    Patient given informed consent for IUD insertion. She has no questions. Signed copy in chart.  UPT negative.  Review of Systems     Objective:   Physical Exam Gen: NAD, Alert, Oriented. Vitals reviewed.   GYN: Patient placed in lithotomy position. Sterile prep and sterile speculum/equipment used. Cervix cleansed X 3 with betadine. Tenaculum used to secure cervix by placement in anterior lip of cervix. Very small amount of blood oozing from this site. Uterine sound used and sounded to 7 and then mirena IUD placed without problems. IUD strings trimmed to 5cm and patient taught how to check for them.  Ibuprofen 400mg  given after procedure.          Assessment & Plan:

## 2010-11-08 LAB — POCT URINALYSIS DIP (DEVICE)
Bilirubin Urine: NEGATIVE
Hgb urine dipstick: NEGATIVE
Nitrite: NEGATIVE
pH: 7 (ref 5.0–8.0)

## 2010-12-20 NOTE — Discharge Summary (Signed)
NAMEISLEY, WEISHEIT                 ACCOUNT NO.:  [id number]   MEDICAL RECORD NO.:  [id number]          PATIENT TYPE:  INP   LOCATION:  9130                          FACILITY:  [facility]   PHYSICIAN:  Tracy L. Mayford Knife, M.D.DATE OF BIRTH:  [birth year]   DATE OF ADMISSION:  06/09/2005  DATE OF DISCHARGE:  06/11/2005                                 DISCHARGE SUMMARY   REASON FOR ADMISSION:  1.  Cervicitis.  2.  Preterm labor.   DISCHARGE DIAGNOSES:  1.  Cervicitis.  2.  Preterm contractions/preterm labor resolved.   PERTINENT LABORATORIES:  Gonorrhea negative.  Chlamydia negative.  Group B  Strep negative.  Wet prep showed moderate wbc's, 3+ bacteria, few clue  cells.  Whiff test was negative.   HISTORY OF PRESENT ILLNESS:  Patient is a 30 year old G1, P0 at 26 and 6  dated by an LMP November 24, 2004 consistent with a 7-week ultrasound that was  transferred from [facility] because she was found to have contractions  on the monitor that she was not feeling.  She denied any contractions or  pain on the day of admission; however, she said she had had a few pains in  her vagina the day prior.  Cervical examination at [facility] showed she  was 1, 50, high, and soft.  Group B Strep, gonorrhea, Chlamydia, wet prep  were all done there.  The only result on admission was the wet prep.  Please  see laboratory results.   PAST MEDICAL HISTORY:  No significant problems.  No history of abnormal  Paps.  This is the first pregnancy.   HOSPITAL COURSE:  On examination in the MAU patient was found to be dilated  to 1 including the internal os, 50% effaced, -3 station.  Baby was reactive  and reassuring on the monitor.  She was having some uterine irritability.  Dr. Gavin Potters also saw her and felt that she should be admitted.  She was  started on Unasyn and she took the azithromycin that had been prescribed to  her at [facility].  That night she did contract some on the monitor.  On  repeat  examination she was not changed.  Please note that Procardia was also  started on admission.  The rest of the night the patient had much less  contractions.  On hospital day two repeat cervical examination revealed that  she was closed internal os and 1 external os, thick, and high.  This was the  same examiner.  Ultrasound done during this hospitalization showed that the  baby was transverse with a normal amniotic fluid index of 19.  Her mean  gestational age by biometry was 30.  She was 75-90th percentile for  estimated gestation.  Her cervix was 4 cm by transabdominal measurement.  Case was discussed with Dr. Okey Dupre and he felt she could be discharged to home  in stable condition.   DISPOSITION:  Home.   FOLLOW-UP:  One week at the high risk clinic at 8 a.m.   DISCHARGE MEDICATIONS:  1.  Procardia XL 30 mg one p.o.  q.12h.  2.  Prenatal vitamins one p.o. daily.   DISCHARGE INSTRUCTIONS:  Preterm labor precautions given.  She is to be on  bed rest.  Diet regular.           ______________________________  Marc Morgans. Mayford Knife, M.D.     TLW/MEDQ  D:  06/11/2005  T:  06/11/2005  Job:  [id number]

## 2011-01-01 ENCOUNTER — Other Ambulatory Visit: Payer: Self-pay | Admitting: Family Medicine

## 2011-01-01 ENCOUNTER — Ambulatory Visit (INDEPENDENT_AMBULATORY_CARE_PROVIDER_SITE_OTHER): Payer: Self-pay | Admitting: Family Medicine

## 2011-01-01 VITALS — BP 113/69 | HR 68 | Temp 98.2°F | Ht 61.5 in | Wt 122.6 lb

## 2011-01-01 DIAGNOSIS — Z0000 Encounter for general adult medical examination without abnormal findings: Secondary | ICD-10-CM

## 2011-01-01 DIAGNOSIS — Z124 Encounter for screening for malignant neoplasm of cervix: Secondary | ICD-10-CM

## 2011-01-01 DIAGNOSIS — R102 Pelvic and perineal pain: Secondary | ICD-10-CM

## 2011-01-01 DIAGNOSIS — N949 Unspecified condition associated with female genital organs and menstrual cycle: Secondary | ICD-10-CM

## 2011-01-01 DIAGNOSIS — Z975 Presence of (intrauterine) contraceptive device: Secondary | ICD-10-CM

## 2011-01-01 NOTE — Progress Notes (Signed)
  Subjective:    Patient ID: Shelley Murray, female    DOB: Feb 02, 1981, 30 y.o.   MRN: [id number]  HPI Pt is here pap smear: LMP 12/10/10.  In sexual relationship with one partner. No history of abnormal pap.  No vaginal discharge.  Pelvic pain IUD 11/07/10 placed.  She has had pain since.  After IUD placement she had a menstrual cycle right away that was very heavy.  Changed tampons 10x/day x 2wks.  Next cycle was on 5/8 until 3 days ago.  She has been spotting every day until 3 days ago.  She also bleeds after sexual intercourse.  She states that she sometimes has pain with intercourse.  She has had IUD in the past and did not have any pelvic pain with it.   Denies fever, chills, nausea, vomiting, abd pain, diarrhea, dyspnea, cough.   Family History: Mother: 33 y/o angina, prediabetes Fahter: healthy Siblings: healthy Breast cancer: none Son: asthma  Social History: Lives in [place] with children ([name] 10/30/09, [name] 08/20/05) and partner [name] 04/12/77).   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 No distress.  Abdominal: Soft. Bowel sounds are normal. She exhibits no distension. There is no tenderness.  Genitourinary: Uterus normal. There is no tenderness or lesion on the right labia. There is no tenderness or lesion on the left labia. Cervix exhibits no motion tenderness, no discharge and no friability. Right adnexum displays no mass, no tenderness and no fullness. Left adnexum displays no mass, no tenderness and no fullness. No erythema, tenderness or bleeding around the vagina. No signs of injury around the vagina. No vaginal discharge found.       IUD strings protruding from cervix, about 7cm long.   I cut this back to 5cm.   Lymphadenopathy:       Right: No inguinal adenopathy present.       Left: No inguinal adenopathy present.          Assessment & Plan:

## 2011-01-02 ENCOUNTER — Encounter: Payer: Self-pay | Admitting: Family Medicine

## 2011-01-02 DIAGNOSIS — Z0000 Encounter for general adult medical examination without abnormal findings: Secondary | ICD-10-CM | POA: Insufficient documentation

## 2011-01-02 DIAGNOSIS — R102 Pelvic and perineal pain: Secondary | ICD-10-CM | POA: Insufficient documentation

## 2011-01-02 NOTE — Assessment & Plan Note (Addendum)
Pt having pain with sexual intercourse since placement of IUD on 11/07/10.  Exam showed length of string about 7cm, which I cut to 5cm.  Will get u/s for placement evaluation.

## 2011-01-02 NOTE — Assessment & Plan Note (Addendum)
Pt complains of pelvic pain after placement of IUD on 11/07/10.  She is also having pain with intercourse.  Pelvic exam done today with negative adnexal mass & tenderness, negative CMT.  Will get tvus.

## 2011-01-02 NOTE — Assessment & Plan Note (Signed)
Pap done today  

## 2011-01-03 ENCOUNTER — Encounter: Payer: Self-pay | Admitting: Family Medicine

## 2011-01-06 ENCOUNTER — Other Ambulatory Visit (HOSPITAL_COMMUNITY): Payer: Self-pay

## 2011-01-06 ENCOUNTER — Ambulatory Visit (HOSPITAL_COMMUNITY): Admission: RE | Admit: 2011-01-06 | Payer: Self-pay | Source: Ambulatory Visit

## 2011-01-07 ENCOUNTER — Ambulatory Visit (HOSPITAL_COMMUNITY)
Admission: RE | Admit: 2011-01-07 | Discharge: 2011-01-07 | Disposition: A | Discharge status: Home / Self Care | Payer: Self-pay | Source: Ambulatory Visit | Attending: Family Medicine | Admitting: Family Medicine

## 2011-01-07 DIAGNOSIS — Z30431 Encounter for routine checking of intrauterine contraceptive device: Secondary | ICD-10-CM | POA: Insufficient documentation

## 2011-01-07 DIAGNOSIS — N949 Unspecified condition associated with female genital organs and menstrual cycle: Secondary | ICD-10-CM | POA: Insufficient documentation

## 2011-01-07 DIAGNOSIS — N938 Other specified abnormal uterine and vaginal bleeding: Secondary | ICD-10-CM | POA: Insufficient documentation

## 2011-01-07 DIAGNOSIS — Z975 Presence of (intrauterine) contraceptive device: Secondary | ICD-10-CM

## 2011-08-28 ENCOUNTER — Encounter: Payer: Self-pay | Admitting: Family Medicine

## 2011-08-28 ENCOUNTER — Ambulatory Visit (INDEPENDENT_AMBULATORY_CARE_PROVIDER_SITE_OTHER): Payer: Self-pay | Admitting: Family Medicine

## 2011-08-28 VITALS — BP 101/63 | HR 62 | Temp 97.8°F | Ht 61.5 in | Wt 124.7 lb

## 2011-08-28 DIAGNOSIS — Z23 Encounter for immunization: Secondary | ICD-10-CM

## 2011-08-28 DIAGNOSIS — R05 Cough: Secondary | ICD-10-CM

## 2011-08-28 NOTE — Patient Instructions (Signed)
Mucinex 1200mg  2 x day as needed for cough. Ibuprofen 600mg  every 6 hours as needed for pain in back. Return if new or worsening of symptoms.

## 2011-09-01 DIAGNOSIS — R05 Cough: Secondary | ICD-10-CM | POA: Insufficient documentation

## 2011-09-01 DIAGNOSIS — R059 Cough, unspecified: Secondary | ICD-10-CM | POA: Insufficient documentation

## 2011-09-01 NOTE — Assessment & Plan Note (Addendum)
Most likely post viral cough.  Or pt has had multiple viral infections.  Symptomatic treatment only at this time.  No red flags on exam.  Reviewed red flags for return.  Pt to return as needed.  Pt encouraged to schedule annual exam- due for this in May.

## 2011-09-01 NOTE — Progress Notes (Signed)
  Subjective:    Patient ID: Shelley Murray, female    DOB: 27-Apr-1981, 31 y.o.   MRN: [id number]  HPI Cough: Seems to have been present since Dec 24th.  Started with a viral syndrome.  Has had some posttussive vomiting.  + nasal congestion off and on.  Occasional sore throat.  No ear pain. No wheezing.  No nausea.  No diarrhea.  Did have small specs of blood in sputum 1 day approx 2 weeks ago.  None since then.  No fever. Has not received flu vaccine.  Eating well. Drinking well.    Review of Systems As per above.     Objective:   Physical Exam  Constitutional: She appears well-developed and well-nourished.  HENT:  Head: Normocephalic and atraumatic.  Neck: No thyromegaly present.  Cardiovascular: Normal rate, regular rhythm and normal heart sounds.   No murmur heard. Pulmonary/Chest: Effort normal. No respiratory distress. She has no wheezes. She has no rales.  Abdominal: Soft. She exhibits no distension.  Musculoskeletal: She exhibits no edema.  Lymphadenopathy:    She has no cervical adenopathy.  Neurological: She is alert.  Skin: No rash noted.  Psychiatric: She has a normal mood and affect.          Assessment & Plan:

## 2011-09-09 IMAGING — US US PELVIS COMPLETE
1 series · 14 of 25 positions shown · non-contrast
Comparison: None.

The *RADIOLOGY REPORT*
CLINICAL DATA: Abnormal uterine bleeding; IUD



[Series 1: us pelvis complete · 60 acquisitions, 14 frames shown]
[im 1/60]
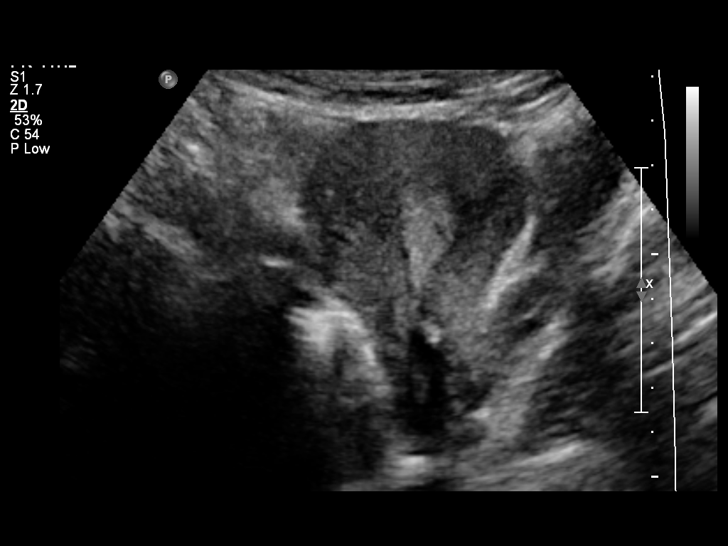
[im 5/60]
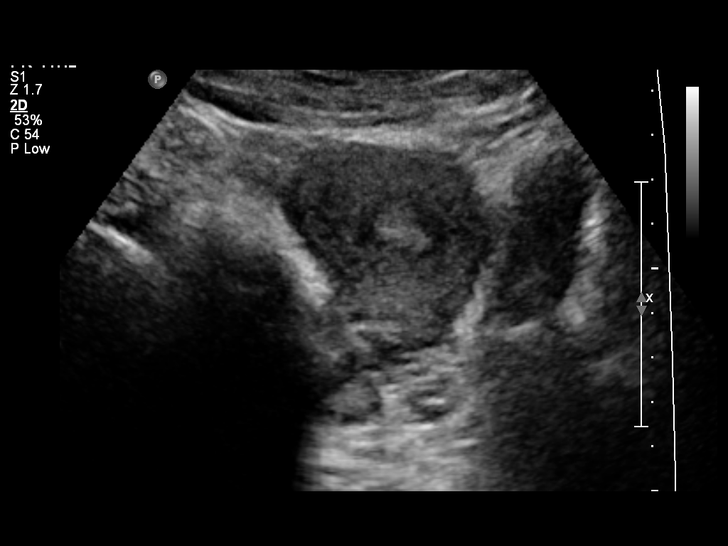
[im 10/60]
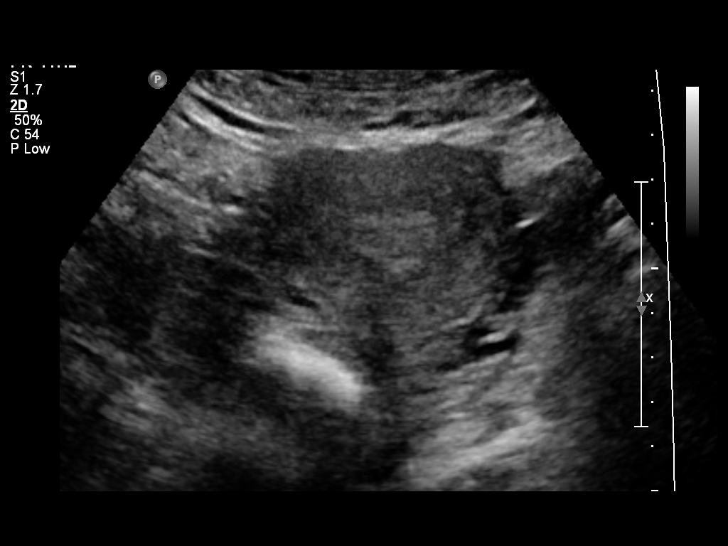
[im 15/60]
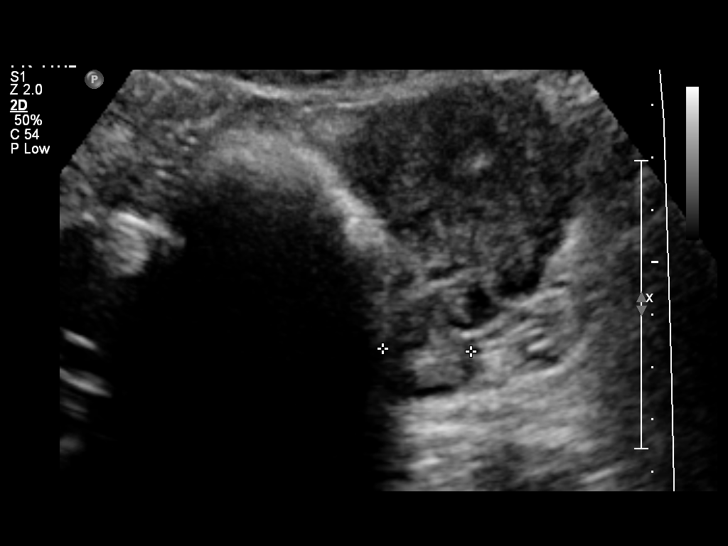
[im 20/60]
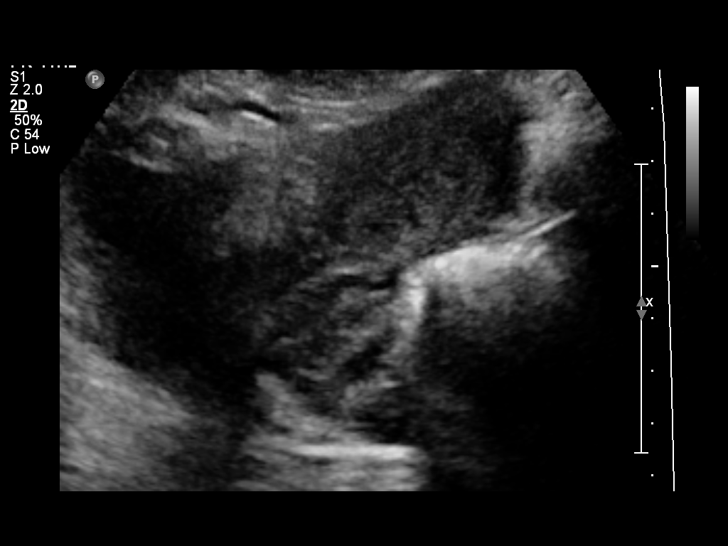
[im 23/60]
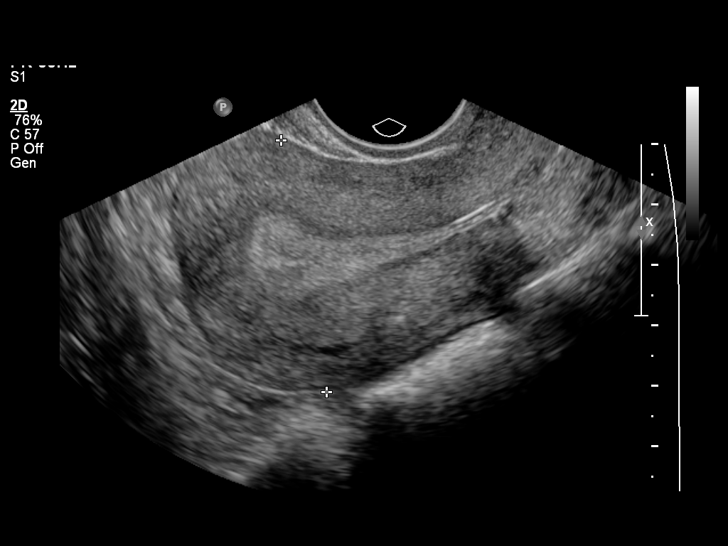
[im 28/60]
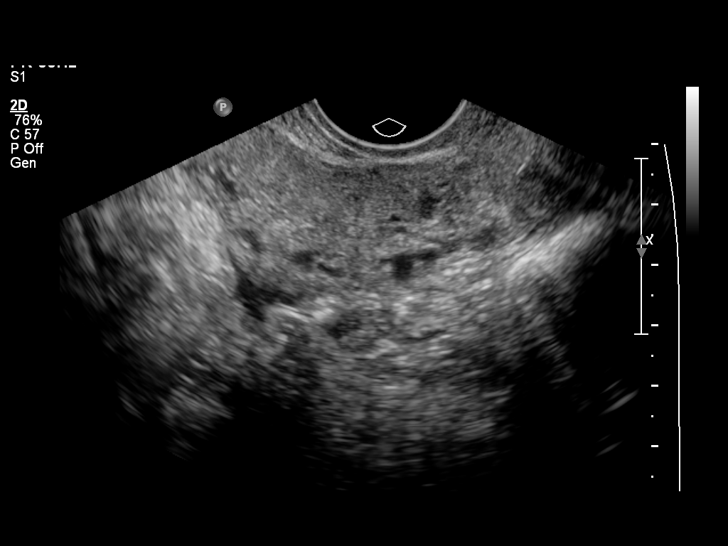
[im 32/60]
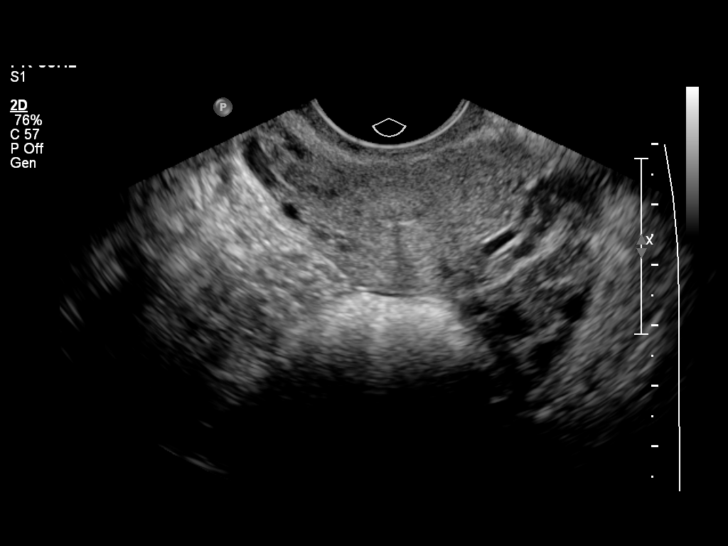
[im 37/60]
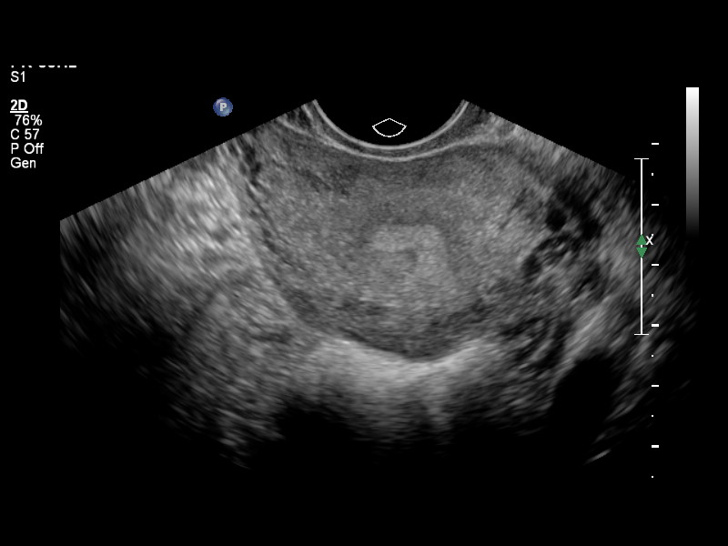
[im 40/60]
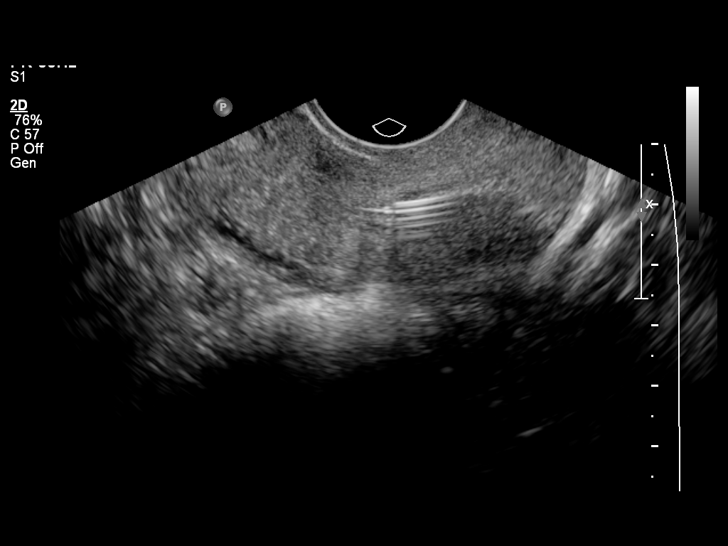
[im 45/60]
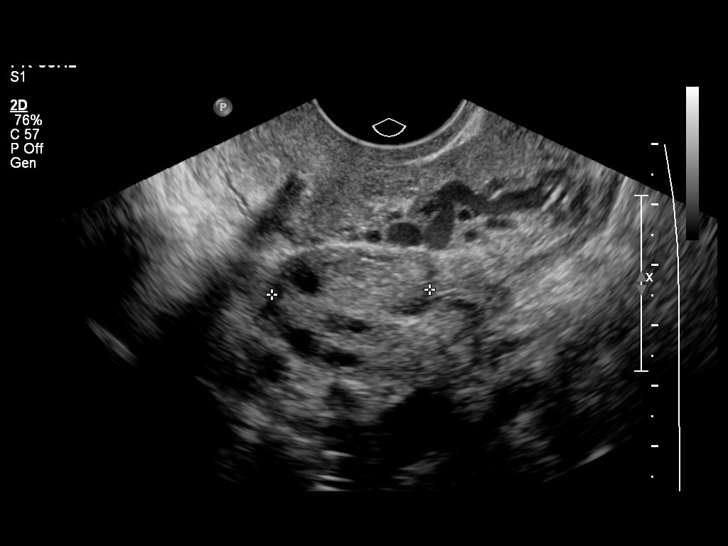
[im 50/60]
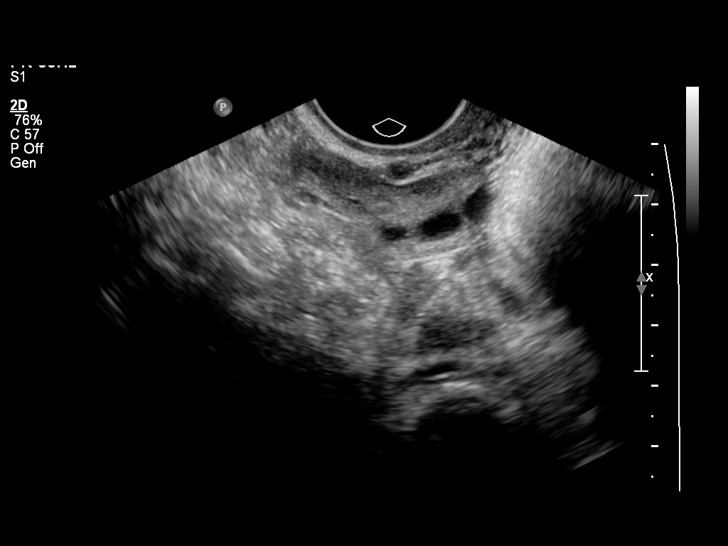
[im 55/60]
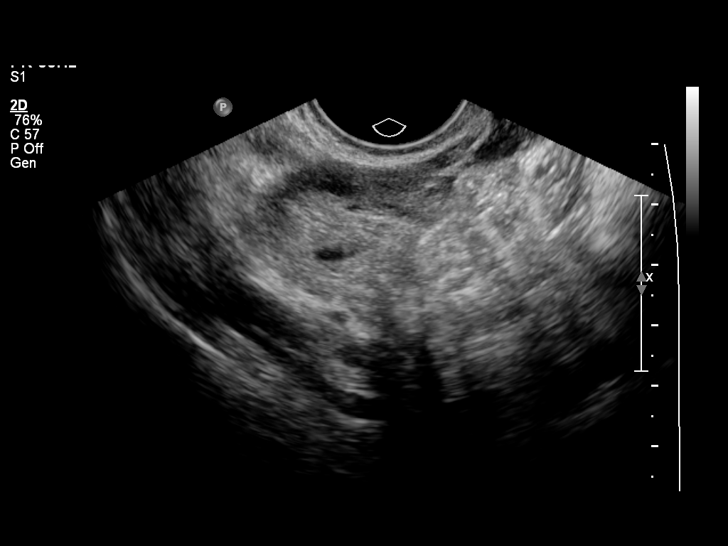
[im 60/60]
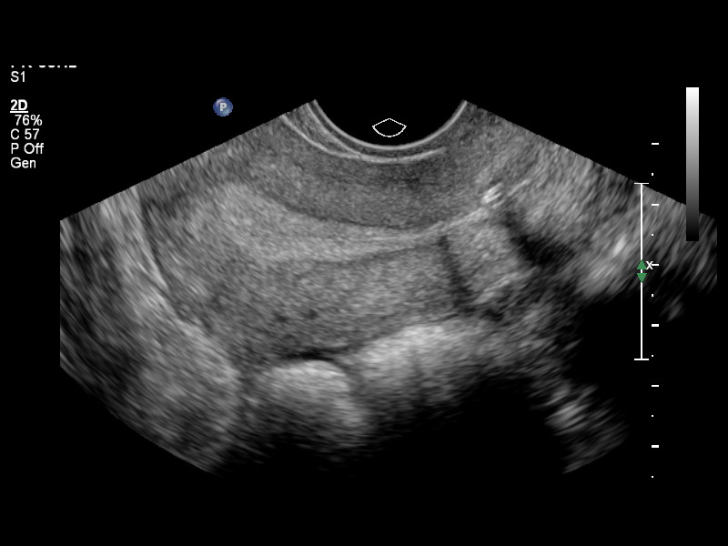

[14 of 25 positions shown; findings below may reference images not displayed]

FINDINGS: The uterus is normal in size and echotexture.  The
uterine dimensions are 7.5 x 4.2 x 6.0 cm.  The endometrial stripe
is homogeneous and within normal limits measuring 10 mm in width.
The IUD is within the endometrial cavity at the lower uterine
segment near the cervix.

Both ovaries have a normal size and appearance.  The right ovary
measures 4.1 x 2.0 x 3.2 cm, in the left ovary measures 3.5 x 1.7 x
2.6 cm.  There are no adnexal masses or free pelvic fluid.
IMPRESSION: The IUD is located within the endometrial cavity but is at the
lower uterine segment near the cervix.

## 2011-09-12 ENCOUNTER — Ambulatory Visit (INDEPENDENT_AMBULATORY_CARE_PROVIDER_SITE_OTHER): Payer: Self-pay | Admitting: Family Medicine

## 2011-09-12 ENCOUNTER — Encounter: Payer: Self-pay | Admitting: Family Medicine

## 2011-09-12 VITALS — BP 121/80 | HR 60 | Temp 98.0°F | Ht 61.5 in | Wt 121.0 lb

## 2011-09-12 DIAGNOSIS — R0781 Pleurodynia: Secondary | ICD-10-CM | POA: Insufficient documentation

## 2011-09-12 DIAGNOSIS — R079 Chest pain, unspecified: Secondary | ICD-10-CM

## 2011-09-12 MED ORDER — HYDROCODONE-ACETAMINOPHEN 5-500 MG PO TABS: 1.0000 | ORAL_TABLET | Freq: Three times a day (TID) | ORAL | Status: AC | PRN

## 2011-09-12 NOTE — Patient Instructions (Signed)
Take 600 mg of ibuprofen 3 times daily for the next 3 days for the pain. I have made an appointment for you please come back and see me then. A few gets a fever or the pain is more associated with food come back sooner.  Tomar 600 mg de ibuprofeno 3 veces al [employer] prximos 3 das para Chief Technology Officer. He hecho una cita para usted por favor, ven a verme entonces. Unos [place] fiebre o el dolor se asocia ms con la comida Government social research officer.

## 2011-09-12 NOTE — Progress Notes (Signed)
  Subjective:    Patient ID: Shelley Murray, female    DOB: 1981-05-22, 31 y.o.   MRN: [id number]  HPI  31 year old female coming in with five-day history of right sided flank pain. Patient states she does not remember any type of injury. Patient states that the pain comes and goes worse with certain movements no association with this area or food intake. Patient does state she has a family history of gallbladder disease. Patient does state she can touch it and can feel that it starts in the anterior right side and then radiates around her side. Patient had been taking ibuprofen and Motrin with no improvement. Patient denies any fevers or chills denies any nausea or vomiting.  Review of Systems As stated in history of present illness    Objective:   Physical Exam General: No apparent distress healthy 31 year old female Cardiovascular: Regular rate and rhythm no murmur declined pulmonary clear to auscultation bilaterally patient does have pain of the right side with deep inspiration. Vitals reviewed and no tachycardia. Abdominal exam: Bowel sounds positive in all 4 quadrants patient hasn't we will tenderness of the right upper quadrant patient though has a negative Murphy sign. Muscle skeletal patient has significant pain right over the 10th and 11th rib on the right side no floating or break palpated. Patient is significantly tender in the middle of the 11th and 10th rib as well even with light palpation    Assessment & Plan:

## 2011-09-12 NOTE — Assessment & Plan Note (Signed)
The patient presented much more muscular skeletal pain not likely gallbladder at this time. Warned patient of symptoms of gallbladder and didn't seek medical attention if they occur which would include fever nausea vomiting unable to tolerate by mouth intake. Patient will return next week for follow up to make sure she improves. Patient declined any type of injection today. Patient also given a prescription for Vicodin with the pain is very bad but seems to be subsiding somewhat.

## 2011-09-18 ENCOUNTER — Ambulatory Visit: Payer: Self-pay | Admitting: Family Medicine

## 2011-09-23 ENCOUNTER — Ambulatory Visit (INDEPENDENT_AMBULATORY_CARE_PROVIDER_SITE_OTHER): Payer: Self-pay | Admitting: Family Medicine

## 2011-09-23 ENCOUNTER — Encounter: Payer: Self-pay | Admitting: Family Medicine

## 2011-09-23 VITALS — BP 110/68 | HR 70 | Temp 97.5°F | Ht 61.5 in | Wt 118.0 lb

## 2011-09-23 DIAGNOSIS — R0781 Pleurodynia: Secondary | ICD-10-CM

## 2011-09-23 DIAGNOSIS — R079 Chest pain, unspecified: Secondary | ICD-10-CM

## 2011-09-23 NOTE — Progress Notes (Signed)
  Subjective:    Patient ID: Shelley Murray, female    DOB: October 15, 1980, 31 y.o.   MRN: [id number]  HPI 31 year old female coming in for followup of his right rib pain. Patient states that it has improved a lot and had gone away for the last 3 days but started working out again yesterday and had the pain come back. Patient states she takes a very deep breath sometimes it itches or that it is much improved from previously. Patient was given Vicodin last time and states she'll he takes it as needed and it has helped as well. No side effects to the medicines. Denies any weight loss denies cough denies any shortness of breath.   Review of Systems As stated above    Objective:   Physical Exam General: No apparent distress healthy 31 year old female Cardiovascular: Regular rate and rhythm no murmur declined pulmonary clear to auscultation bilaterally patient does have pain of the right side with deep inspiration. Vitals reviewed and no tachycardia. Abdominal exam: Bowel sounds positive in all 4 quadrants patient hasn't we will tenderness of the right upper quadrant patient though has a negative Murphy sign. Muscle skeletal pain right over the 10th and 11th rib on the right side no floating or break palpated. Patient though able to tolerate much more pressure than previous exam.     Assessment & Plan:

## 2011-09-23 NOTE — Patient Instructions (Signed)
Patient given verbal instructions 

## 2011-09-23 NOTE — Assessment & Plan Note (Signed)
Patient is improved significantly told her to do anti-inflammatories in the meantime and try to spare her Vicodin. Patient told that pain is finding that she can continue to work out Followup as needed.

## 2011-12-08 ENCOUNTER — Ambulatory Visit (INDEPENDENT_AMBULATORY_CARE_PROVIDER_SITE_OTHER): Payer: Self-pay | Admitting: Family Medicine

## 2011-12-08 ENCOUNTER — Encounter: Payer: Self-pay | Admitting: Family Medicine

## 2011-12-08 VITALS — BP 116/69 | HR 60 | Temp 98.3°F | Ht 61.5 in | Wt 115.0 lb

## 2011-12-08 DIAGNOSIS — J029 Acute pharyngitis, unspecified: Secondary | ICD-10-CM

## 2011-12-08 DIAGNOSIS — J069 Acute upper respiratory infection, unspecified: Secondary | ICD-10-CM

## 2011-12-08 LAB — POCT RAPID STREP A (OFFICE): Rapid Strep A Screen: NEGATIVE

## 2011-12-08 NOTE — Assessment & Plan Note (Signed)
Exam clinically consistent with URI. Rapid strep negative. Discussed supportive care and infectious red flags. Handout given. Follow up prn.

## 2011-12-08 NOTE — Progress Notes (Signed)
  Subjective:    Patient ID: Shelley Murray, female    DOB: Aug 23, 1980, 31 y.o.   MRN: [id number]  HPI URI Symptoms Onset: 3-4 days  Description: rhinorrhea, congestions, otalgia, sore throat  Modifying factors:  none  Symptoms Nasal discharge: yes Fever: mild Sore throat: yes Cough: no Wheezing: no Ear pain: yes GI symptoms: no Sick contacts: unknown  Red Flags  Stiff neck: no Dyspnea: no Rash: no Swallowing difficulty: no  Sinusitis Risk Factors Headache/face pain: no Double sickening: no tooth pain: no  Allergy Risk Factors Sneezing: no Itchy scratchy throat: yes Seasonal symptoms: yes  Flu Risk Factors Headache: no muscle aches: no severe fatigue: no      Review of Systems See HPI, otherwise ROS negative     Objective:   Physical Exam Gen: up in chair, NAD HEENT: NCAT, EOMI, TMs clear bilaterally, +nasal erythema, rhinorrhea bilaterally, + post oropharyngeal erythema, no tonsillar exudate  CV: RRR, no murmurs auscultated PULM: CTAB, no wheezes, rales, rhoncii ABD: S/NT/+ bowel sounds  EXT: 2+ peripheral pulses         Assessment & Plan:

## 2011-12-08 NOTE — Patient Instructions (Signed)
Faringitis (viral y bacteriana) (Pharyngitis, Viral and Bacterial) Es una inflamacin (irritacin) o infeccin de la faringe. Tambin se denomina dolor de garganta.  CAUSAS La mayor parte de las anginas son causadas por virus y son parte de un resfro. Sin embargo, algunas anginas son ocasionadas por la bacteria estreptococo (germen) o por otras bacterias. La causa de la angina tambin puede ser el goteo nasal proveniente de los senos paranasales, alergias y, en algunos casos, se produce incluso por dormir con la boca abierta. Las anginas infecciosas pueden contagiarse de una persona a otra por la tos, el estornudo y por compartir tasas o cubiertos. TRATAMIENTO Las anginas de causa viral generalmente duran entre 3 y 4 das. Estas infecciones virales generalmente mejoran sin antibiticos (medicamentos que destruyen grmenes). Las anginas por estreptococo y otras bacterias (grmenes) generalmente mejoran despus de 24 a 48 horas de comenzar el tratamiento con antibiticos. INSTRUCCIONES PARA EL CUIDADO DOMICILIARIO  Si en profesional considera que se trata de una infeccin bacteriana o si hay una prueba positiva para el estreptococo, le prescribir un antibitico. Debe tomar todos los antibiticos hasta completar el tratamiento. Si no completa el tratamiento con antibiticos, usted o su hijo podrn enfermar nuevamente. Si usted o su hijo presentan un estreptococo en la garganta y no completan el tratamiento, podran sufrir un trastorno grave en el corazn o en los riones.   Beba gran cantidad de lquidos. Alrededor de 8-10 vasos grandes de agua por da. (Puede ser agua, jugos, licuados de frutas, Kool-aid, Gatorade, gaseosas, etc.).   Utilice los medicamentos de venta libre o de prescripcin para el dolor, el malestar o la fiebre, segn se lo indique el profesional que lo asiste.   Descanse lo suficiente.   Hgase grgaras con agua salada (1/2 cucharadita de sal en un vaso de agua) cada 1-2 horas si  lo necesita para aliviar las molestias.   Si el paciente es mayor de siete aos, ofrzcale caramelos duros o aerosoles o pastillas para la tos.  SOLICITE ATENCIN MDICA SI:  Si le aparecen bultos grandes y dolorosos en el cuello.   Presenta una erupcin.   Elimina un esputo verde, marrn-amarillento o sanguinolento.   El beb tiene ms de 3 meses y su temperatura rectal es de 100.5 F (38.1 C) o ms durante ms de 1 da.  SOLICITE ATENCIN MDICA DE INMEDIATO SI:  Siente rigidez en el cuello.   Usted o su hijo babean o no pueden tragar lquidos.   Usted o su hijo vomitan, no pueden retener los medicamentos o los lquidos.   Usted o su hijo presentan dolor intenso, que no se alivia con los medicamentos que le han recetado.   Usted o su hijo presentan dificultad para respirar (y no se debe a la nariz congestionada).   Usted o su hijo no pueden abrir la boca completamente.   Usted o su nio presentan aumento del dolor, hinchazn, enrojecimiento en el cuello.   Tiene fiebre.   Su beb tiene ms de 3 meses y su temperatura rectal es de 102 F (38.9 C) o ms.   Su beb tiene 3 meses o menos y su temperatura rectal es de 100.4 F (38 C) o ms.  EST SEGURO QUE:   Comprende las instrucciones para el alta mdica.   Controlar su enfermedad.   Solicitar atencin mdica de inmediato segn las indicaciones.  Document Released: 04/30/2005 Document Revised: 07/10/2011 [employer] Patient Information 2012 [employer]. 

## 2011-12-25 ENCOUNTER — Ambulatory Visit (INDEPENDENT_AMBULATORY_CARE_PROVIDER_SITE_OTHER): Payer: Self-pay | Admitting: Family Medicine

## 2011-12-25 ENCOUNTER — Encounter: Payer: Self-pay | Admitting: Family Medicine

## 2011-12-25 ENCOUNTER — Other Ambulatory Visit (HOSPITAL_COMMUNITY)
Admission: RE | Admit: 2011-12-25 | Discharge: 2011-12-25 | Disposition: A | Discharge status: Home / Self Care | Payer: Self-pay | Source: Ambulatory Visit | Attending: Family Medicine | Admitting: Family Medicine

## 2011-12-25 VITALS — BP 95/58 | HR 57 | Temp 98.1°F | Ht 62.0 in | Wt 115.4 lb

## 2011-12-25 DIAGNOSIS — Z9189 Other specified personal risk factors, not elsewhere classified: Secondary | ICD-10-CM

## 2011-12-25 DIAGNOSIS — Z113 Encounter for screening for infections with a predominantly sexual mode of transmission: Secondary | ICD-10-CM | POA: Insufficient documentation

## 2011-12-25 DIAGNOSIS — Z23 Encounter for immunization: Secondary | ICD-10-CM

## 2011-12-25 DIAGNOSIS — Z01419 Encounter for gynecological examination (general) (routine) without abnormal findings: Secondary | ICD-10-CM | POA: Insufficient documentation

## 2011-12-25 DIAGNOSIS — Z0000 Encounter for general adult medical examination without abnormal findings: Secondary | ICD-10-CM

## 2011-12-25 DIAGNOSIS — Z124 Encounter for screening for malignant neoplasm of cervix: Secondary | ICD-10-CM

## 2011-12-25 DIAGNOSIS — Z202 Contact with and (suspected) exposure to infections with a predominantly sexual mode of transmission: Secondary | ICD-10-CM

## 2011-12-25 LAB — LIPID PANEL
Cholesterol: 106 mg/dL (ref 0–200)
Total CHOL/HDL Ratio: 2.9 Ratio

## 2011-12-25 NOTE — Progress Notes (Signed)
  Subjective:    Patient ID: Shelley Murray, female    DOB: July 07, 1981, 31 y.o.   MRN: [id number]  HPI Patient here for annual exam  PMH, Surgical history, Family History, allergies, medications and social history all updated under appropriate tabs.    Health maintenance: Patient has history of first degree relative with diabetes-patient agrees to diabetes screening today. Patient has history of preeclampsia-blood pressure 95/58 today. Patient exercising 3 times per week-as above. Patient eat healthy diet-5 prescheduled per day. Chooses healthy meats. Pt due for tetanus- agrees to have this given today. STD screen- states she is low risk but would like to have GC/Chlam screen done with pap today. No baseline lipid panel on patient-did eat small breakfast this morning-but would like to go ahead with lipid panel. Pap smear-has negative pap smear in 12/2010 and in 12/2009.  Pt reports that she once was told 4-5 years ago that she had some abnormal inflammatory cells on pap smear but that these haven't been present for the past 3-4 pap smears.  Pt would like to go to every 3 year screening if at all possible.  Breast exam- agrees to this today-no nodules noted in breast on self breast exam.  10lb weight loss since being more active and eating healthier.      Review of Systems  No cold symptoms. No fever. No abd pain. No sob. No chest pain.  No problems with bowel or bladder.  No swelling. No joint pain.     Objective:   Physical Exam  Constitutional: She is oriented to person, place, and time. She appears well-developed and well-nourished.  HENT:  Head: Normocephalic and atraumatic.  Mouth/Throat: Oropharynx is clear and moist.  Eyes: EOM are normal. Right eye exhibits no discharge. Left eye exhibits no discharge.  Neck: Normal range of motion. No tracheal deviation present. No thyromegaly present.  Cardiovascular: Normal rate, regular rhythm and normal heart sounds.   No murmur  heard. Pulmonary/Chest: Effort normal and breath sounds normal. No respiratory distress. She has no wheezes. She has no rales.  Abdominal: Soft. She exhibits no distension. There is no tenderness. There is no rebound and no guarding.  Genitourinary: Uterus normal. No breast swelling (no nodules or lumps palpated), tenderness, discharge or bleeding. There is no rash, tenderness, lesion or injury on the right labia. There is no rash, tenderness, lesion or injury on the left labia. Cervix exhibits discharge (minimal yellow). Cervix exhibits no motion tenderness and no friability. Right adnexum displays no mass, no tenderness and no fullness. Left adnexum displays no mass, no tenderness and no fullness. No tenderness around the vagina. No signs of injury around the vagina. No vaginal discharge (minimal yellow) found.  Musculoskeletal: She exhibits no edema.  Lymphadenopathy:    She has no cervical adenopathy.  Neurological: She is alert and oriented to person, place, and time.  Skin: No rash noted.  Psychiatric: She has a normal mood and affect. Her behavior is normal.          Assessment & Plan:

## 2011-12-25 NOTE — Assessment & Plan Note (Signed)
Patient has history of first degree relative with diabetes-patient agrees to diabetes screening today. Patient has history of preeclampsia-blood pressure 95/58 today. Patient exercising 3 times per week-zumba Patient eat healthy diet-5 fruit/vegtables per day. Chooses healthy meats. Pt due for tetanus- agrees to have this given today. STD screen- states she is low risk but would like to have GC/Chlam screen done with pap today. No baseline lipid panel on patient-did eat small breakfast this morning-but would like to go ahead with lipid panel. Pap smear-has negative pap smear in 12/2010 and in 12/2009.  Pt reports that she once was told 4-5 years ago that she had some abnormal inflammatory cells on pap smear but that these haven't been present for the past 3-4 pap smears. Only normal results x 2 years present in chart.  Pt would like to go to every 3 year screening if at all possible.  Pt states that [facility]- but I could not find it in record.  - if normal would recommend repeat pap in 2 years since h/o abnormal in past and no records available to let me know what abnormality was present.  Encouraged continued healthy eating and exercise.

## 2011-12-30 ENCOUNTER — Encounter: Payer: Self-pay | Admitting: Family Medicine

## 2011-12-30 ENCOUNTER — Telehealth: Payer: Self-pay | Admitting: Family Medicine

## 2011-12-30 NOTE — Telephone Encounter (Signed)
Called patient on telephone today to review lab results. Answered all patient's questions. Patient to return in one year for annual physical or sooner if needed.

## 2012-06-23 ENCOUNTER — Ambulatory Visit (INDEPENDENT_AMBULATORY_CARE_PROVIDER_SITE_OTHER): Payer: Self-pay | Admitting: *Deleted

## 2012-06-23 DIAGNOSIS — Z23 Encounter for immunization: Secondary | ICD-10-CM

## 2013-11-14 ENCOUNTER — Other Ambulatory Visit (HOSPITAL_COMMUNITY): Payer: Self-pay | Admitting: *Deleted

## 2013-11-14 DIAGNOSIS — N644 Mastodynia: Secondary | ICD-10-CM

## 2013-11-15 ENCOUNTER — Ambulatory Visit (HOSPITAL_COMMUNITY)
Admission: RE | Admit: 2013-11-15 | Discharge: 2013-11-15 | Disposition: A | Discharge status: Home / Self Care | Payer: Self-pay | Source: Ambulatory Visit | Attending: Obstetrics and Gynecology | Admitting: Obstetrics and Gynecology

## 2013-11-15 ENCOUNTER — Encounter (INDEPENDENT_AMBULATORY_CARE_PROVIDER_SITE_OTHER): Payer: Self-pay

## 2013-11-15 ENCOUNTER — Encounter (HOSPITAL_COMMUNITY): Payer: Self-pay

## 2013-11-15 VITALS — BP 112/64 | Temp 98.6°F | Ht 62.0 in | Wt 114.0 lb

## 2013-11-15 DIAGNOSIS — N6320 Unspecified lump in the left breast, unspecified quadrant: Secondary | ICD-10-CM

## 2013-11-15 DIAGNOSIS — Z1239 Encounter for other screening for malignant neoplasm of breast: Secondary | ICD-10-CM

## 2013-11-15 DIAGNOSIS — N6325 Unspecified lump in the left breast, overlapping quadrants: Secondary | ICD-10-CM | POA: Insufficient documentation

## 2013-11-15 NOTE — Progress Notes (Signed)
Complaints of left breast lump x 6 months that started becoming painful 3 months ago. Patient rated pain at a 4 out of 10.  Pap Smear:  Pap smear not completed today. Last Pap smear was 09/05/2013 at the free cervical cancer screening at [facility] and normal. Per patient has no history of an abnormal Pap smear. Last Pap smear result is not in EPIC.  Physical exam: Breasts Breasts symmetrical. No skin abnormalities bilateral breasts. No nipple retraction bilateral breasts. Bilateral nipple dryness. No nipple discharge bilateral breasts. No lymphadenopathy. No lumps palpated right breast. Palpated a lump within the left breast at 3 o'clock 4 cm from the nipple. Complaints of left outer breast tenderness on exam. [facility]. Appointment scheduled for November 22, 2013 at 1445.    Pelvic/Bimanual No Pap smear completed today since last Pap smear was 09/05/2013. Pap smear not indicated per BCCCP guidelines.

## 2013-11-15 NOTE — Patient Instructions (Signed)
Taught Vivi FernsEva Benitez Hernandez how to perform BSE and gave educational materials to take home. Patient did not need a Pap smear today due to last Pap smear was 09/05/2013. Let her know BCCCP will cover Pap smears every 3 years unless has a history of abnormal Pap smears. [facility]. Appointment scheduled for November 22, 2013 at 1445. Patient aware of appointment and will be there.  Vivi FernsEva Benitez Hernandez verbalized understanding.  Saintclair Halstedhristine Poll [place], RN 12:38 PM

## 2013-11-22 ENCOUNTER — Ambulatory Visit
Admission: RE | Admit: 2013-11-22 | Discharge: 2013-11-22 | Disposition: A | Discharge status: Home / Self Care | Payer: No Typology Code available for payment source | Source: Ambulatory Visit | Attending: Obstetrics and Gynecology | Admitting: Obstetrics and Gynecology

## 2013-11-22 DIAGNOSIS — N644 Mastodynia: Secondary | ICD-10-CM

## 2014-06-05 ENCOUNTER — Encounter (HOSPITAL_COMMUNITY): Payer: Self-pay

## 2015-01-19 ENCOUNTER — Ambulatory Visit: Payer: Self-pay

## 2016-02-08 ENCOUNTER — Encounter: Payer: Self-pay | Admitting: *Deleted

## 2016-02-11 ENCOUNTER — Ambulatory Visit (INDEPENDENT_AMBULATORY_CARE_PROVIDER_SITE_OTHER): Payer: Self-pay | Admitting: Obstetrics & Gynecology

## 2016-02-11 ENCOUNTER — Encounter: Payer: Self-pay | Admitting: Obstetrics & Gynecology

## 2016-02-11 VITALS — BP 129/61 | HR 66 | Wt 124.1 lb

## 2016-02-11 DIAGNOSIS — Z975 Presence of (intrauterine) contraceptive device: Secondary | ICD-10-CM

## 2016-02-11 DIAGNOSIS — T8389XA Other specified complication of genitourinary prosthetic devices, implants and grafts, initial encounter: Secondary | ICD-10-CM

## 2016-02-11 NOTE — Progress Notes (Signed)
Patient ID: Shelley Murray, female   DOB: 12/18/1980, 35 y.o.   MRN: [id number]  Chief Complaint  Patient presents with  . Follow-up    Retained arm of IUD    HPI Shelley Murray is a 35 y.o. female.  [id number] No LMP recorded. Patient has had an injection. IUD was removed at the HD last month but one arm was retained as demonstrated by US. She has some pain worse with menses. She consents to try removal in office, understanding that this may fail and hysteroscopy may be needed.  HPI  Past Medical History  Diagnosis Date  . Preeclampsia 2007    during pregnancy    Past Surgical History  Procedure Laterality Date  . Mirena  11/07/10    Family History  Problem Relation Age of Onset  . Diabetes Mother   . Heart disease Mother   . Asthma Son   . Breast cancer Maternal Grandmother     Social History Social History  Substance Use Topics  . Smoking status: Never Smoker   . Smokeless tobacco: Never Used  . Alcohol Use: Yes     Comment: 2 drinks per year    No Known Allergies  Current Outpatient Prescriptions  Medication Sig Dispense Refill  . levonorgestrel (MIRENA) 20 MCG/24HR IUD 1 each by Intrauterine route once. Inserted 11/07/10     . medroxyPROGESTERone (DEPO-PROVERA) 150 MG/ML injection Inject 150 mg into the muscle every 3 (three) months.    Marland Kitchen. levonorgestrel (MIRENA) 20 MCG/24HR IUD 1 each by Intrauterine route once. 1 each 0   No current facility-administered medications for this visit.    Review of Systems Review of Systems  Constitutional: Negative.   Gastrointestinal: Negative.   Genitourinary: Positive for pelvic pain. Negative for vaginal discharge and menstrual problem.    Blood pressure 129/61, pulse 66, weight 124 lb 1.6 oz (56.291 kg).  Physical Exam Physical Exam  Constitutional: She is oriented to person, place, and time. She appears well-developed.  Genitourinary: Vagina normal. No vaginal discharge found.  Prep with betadine and  grasped with tenaculum. Probed with dressing forceps and hook and unable to remove IUD arm  Neurological: She is alert and oriented to person, place, and time.  Psychiatric: She has a normal mood and affect. Her behavior is normal.    Data Reviewed US result shows foreign body in LUS  Assessment    Retained portion of IUD      Plan    Financial assistance  Application to have hysteroscopic removal of foreign body       ARNOLD,JAMES 02/11/2016, 4:56 PM

## 2016-04-29 ENCOUNTER — Encounter: Payer: Self-pay | Admitting: Pediatric Intensive Care

## 2016-04-29 DIAGNOSIS — Z139 Encounter for screening, unspecified: Secondary | ICD-10-CM

## 2016-04-29 NOTE — Congregational Nurse Program (Signed)
Congregational Nurse Program Note  Date of Encounter: 04/29/2016  Past Medical History: Past Medical History:  Diagnosis Date  . Preeclampsia 2007   during pregnancy    Encounter Details:     CNP Questionnaire - 04/29/16 1420      Patient Demographics   Is this a new or existing patient? New   Patient is considered a/an Immigrant   Race Latino/Hispanic     Patient Assistance   Location of Patient Assistance [place]   Patient's financial/insurance status Self-Pay   Uninsured Patient Yes   Interventions Not Applicable   Patient referred to apply for the following financial assistance [employer]   Food insecurities addressed Not Applicable   Transportation assistance No   Assistance securing medications No   Product/process development scientistducational health offerings Navigating the healthcare system     Encounter Details   Primary purpose of visit Navigating the Healthcare System   Was an Emergency Department visit averted? Not Applicable   Does patient have a medical provider? Yes   Patient referred to Other (comment);Follow up with established PCP   Was a mental health screening completed? (GAINS tool) No   Does patient have dental issues? No   Does patient have vision issues? No   Does your patient have an abnormal blood pressure today? No   Since previous encounter, have you referred patient for abnormal blood pressure that resulted in a new diagnosis or medication change? No   Does your patient have an abnormal blood glucose today? No   Since previous encounter, have you referred patient for abnormal blood glucose that resulted in a new diagnosis or medication change? No   Was there a life-saving intervention made? No     Client at clinic for advice about retained IUD part and having IUD part removed. Client stated via interpreter Adelina MingsKelsey and Midge MiniumBSWI Perla that she had [facility] to have [facility]UD [facility] and that a part was retained. Client states that she was referred to  clinic at [facility] and that they were unable to remove retained IUD part. Client was told that IUD part would have to be surgically removed. Client states that she applied for financial assistance but was denied and told to reapply in six months. Client states that she requested information regarding self-pay for procedure but was never given that information by finacial representative. CN and BSWI will contact clinic staff regarding procedure on behalf of client.

## 2016-04-30 ENCOUNTER — Telehealth: Payer: Self-pay

## 2016-04-30 NOTE — Telephone Encounter (Signed)
Con

## 2016-05-28 ENCOUNTER — Encounter (HOSPITAL_COMMUNITY): Payer: Self-pay | Admitting: *Deleted

## 2016-05-29 ENCOUNTER — Encounter (HOSPITAL_COMMUNITY): Payer: Self-pay | Admitting: *Deleted

## 2016-05-29 ENCOUNTER — Other Ambulatory Visit: Payer: Self-pay | Admitting: Obstetrics & Gynecology

## 2016-06-11 NOTE — Patient Instructions (Signed)
Your procedure is scheduled on:  Monday, Nov. 13, 2017  Enter through the Main Entrance of [facility] at:  12:45 PM  Pick up the phone at the desk and dial [phone].  Call this number if you have problems the morning of surgery: [phone].  Remember: Do NOT eat food:  After Midnight Sunday  Do NOT drink clear liquids after:  10:00 AM day of surgery  Take these medicines the morning of surgery with a SIP OF WATER:  None  Stop ALL herbal medications at this time   Do NOT wear jewelry (body piercing), metal hair clips/bobby pins, make-up, or nail polish. Do NOT wear lotions, powders, or perfumes.  You may wear deodorant. Do NOT shave for 48 hours prior to surgery. Do NOT bring valuables to the hospital. Contacts, dentures, or bridgework may not be worn into surgery.   Have a responsible adult drive you home and stay with you for 24 hours after your procedure  Instrucciones:  Su cirugia esta programada para-( your procedure is scheduled on) :  Monday, Nov. 13, 2017   Entre por la entrada principal a la(s) -(enter through the main entrance at):   12:45 PM   [place] [employer] el telefono,  [place] el [phone] e informenos de su llegada ( pick up phone, dial [phone] on arrival)  Por favor llame al [phone] si tiene algun problema la Lily Kochermanana de cirugia ( please call [phone] if you have any problems the morning of surgery.)  Recuerde: (Remember)  No coma alimentos ni tome liquidos, incluyendo agua, despues de la medianoche del  ( Do not eat food or drink liquids including water after midnight on  After Midnight Sunday  Tome estas medicinas la manana de la cirugia con un sorbito de agua (take these meds the morning of surgery with a SIP of water) None  Puede cepillarse los dientes en la manana de la [place]. (you may brush your teeth the morning of surgery)  NO use joyas, maquillaje de ojos, lapiz labial, crema para el cuerpo o esmalte de unas oscuro - las unas de los pies pueden  estar pintados. ( Do not wear jewelry, eye makeup, lipstick, body lotion, or dark fingernail polish)  Puede usar desodorante ( you may wear deodorant)  Si va a ser ingresado despues de las [place], deje la [place] en el carro hasta que se le haya asignado una habitacion. ( If you are to be admitted after surgery, leave suitcase in car until your room has been assigned.)  A los pacientes que se les de de alta el mismo dia no se les permitira manejar a casa.  ( Patients discharged on the day of surgery will not be allowed to drive home)  Use ropa suelta y comoda de regreso a Technical sales engineercaso. ( wear loose comfortable clothes for ride home)  Firma del paciente (patient signature) ______________________________________

## 2016-06-12 ENCOUNTER — Other Ambulatory Visit: Payer: Self-pay | Admitting: Obstetrics & Gynecology

## 2016-06-12 ENCOUNTER — Encounter (HOSPITAL_COMMUNITY)
Admission: RE | Admit: 2016-06-12 | Discharge: 2016-06-12 | Disposition: A | Discharge status: Home / Self Care | Payer: Self-pay | Source: Ambulatory Visit | Attending: Obstetrics & Gynecology | Admitting: Obstetrics & Gynecology

## 2016-06-12 ENCOUNTER — Encounter (HOSPITAL_COMMUNITY): Payer: Self-pay

## 2016-06-12 DIAGNOSIS — Z01818 Encounter for other preprocedural examination: Secondary | ICD-10-CM | POA: Insufficient documentation

## 2016-06-12 LAB — CBC
HCT: 35.2 % — ABNORMAL LOW (ref 36.0–46.0)
HEMOGLOBIN: 12.1 g/dL (ref 12.0–15.0)
MCH: 27.9 pg (ref 26.0–34.0)
MCHC: 34.4 g/dL (ref 30.0–36.0)
MCV: 81.3 fL (ref 78.0–100.0)
PLATELETS: 366 10*3/uL (ref 150–400)
RBC: 4.33 MIL/uL (ref 3.87–5.11)
RDW: 13.3 % (ref 11.5–15.5)
WBC: 8.7 10*3/uL (ref 4.0–10.5)

## 2016-06-16 ENCOUNTER — Other Ambulatory Visit: Payer: Self-pay | Admitting: Obstetrics & Gynecology

## 2016-06-16 ENCOUNTER — Ambulatory Visit (HOSPITAL_COMMUNITY)
Admission: RE | Admit: 2016-06-16 | Discharge: 2016-06-16 | Disposition: A | Discharge status: Home / Self Care | Payer: Self-pay | Source: Ambulatory Visit | Attending: Obstetrics & Gynecology | Admitting: Obstetrics & Gynecology

## 2016-06-16 ENCOUNTER — Ambulatory Visit (HOSPITAL_COMMUNITY): Payer: Self-pay | Admitting: Certified Registered Nurse Anesthetist

## 2016-06-16 ENCOUNTER — Encounter (HOSPITAL_COMMUNITY)
Admission: RE | Disposition: A | Discharge status: Home / Self Care | Payer: Self-pay | Source: Ambulatory Visit | Attending: Obstetrics & Gynecology

## 2016-06-16 ENCOUNTER — Encounter (HOSPITAL_COMMUNITY): Payer: Self-pay

## 2016-06-16 DIAGNOSIS — R102 Pelvic and perineal pain: Secondary | ICD-10-CM | POA: Insufficient documentation

## 2016-06-16 DIAGNOSIS — Z975 Presence of (intrauterine) contraceptive device: Secondary | ICD-10-CM

## 2016-06-16 DIAGNOSIS — Z711 Person with feared health complaint in whom no diagnosis is made: Secondary | ICD-10-CM | POA: Insufficient documentation

## 2016-06-16 DIAGNOSIS — Z793 Long term (current) use of hormonal contraceptives: Secondary | ICD-10-CM | POA: Insufficient documentation

## 2016-06-16 HISTORY — PX: IUD REMOVAL: SHX5392

## 2016-06-16 LAB — PREGNANCY, URINE: PREG TEST UR: NEGATIVE

## 2016-06-16 SURGERY — REMOVAL, INTRAUTERINE DEVICE
Anesthesia: General | Site: Vagina

## 2016-06-16 MED ORDER — KETOROLAC TROMETHAMINE 30 MG/ML IJ SOLN
INTRAMUSCULAR | Status: AC
  Filled 2016-06-16: qty 1

## 2016-06-16 MED ORDER — MIDAZOLAM HCL 2 MG/2ML IJ SOLN
INTRAMUSCULAR | Status: DC | PRN
Start: 2016-06-16 — End: 2016-06-16
  Administered 2016-06-16 (×2): 1 mg via INTRAVENOUS

## 2016-06-16 MED ORDER — OXYCODONE HCL 5 MG PO TABS: 5.0000 mg | ORAL_TABLET | Freq: Once | ORAL | Status: DC | PRN

## 2016-06-16 MED ORDER — ACETAMINOPHEN 325 MG PO TABS: 325.0000 mg | ORAL_TABLET | ORAL | Status: DC | PRN

## 2016-06-16 MED ORDER — OXYCODONE HCL 5 MG/5ML PO SOLN
5.0000 mg | Freq: Once | ORAL | Status: DC | PRN
  Filled 2016-06-16: qty 5

## 2016-06-16 MED ORDER — ACETAMINOPHEN 160 MG/5ML PO SOLN: 325.0000 mg | ORAL | Status: DC | PRN

## 2016-06-16 MED ORDER — ONDANSETRON HCL 4 MG/2ML IJ SOLN
INTRAMUSCULAR | Status: AC
  Filled 2016-06-16: qty 2

## 2016-06-16 MED ORDER — LACTATED RINGERS IV SOLN: INTRAVENOUS | Status: DC

## 2016-06-16 MED ORDER — DEXAMETHASONE SODIUM PHOSPHATE 10 MG/ML IJ SOLN
INTRAMUSCULAR | Status: DC | PRN
  Administered 2016-06-16: 4 mg via INTRAVENOUS

## 2016-06-16 MED ORDER — GLYCOPYRROLATE 0.2 MG/ML IJ SOLN
INTRAMUSCULAR | Status: AC
  Filled 2016-06-16: qty 1

## 2016-06-16 MED ORDER — LIDOCAINE HCL (CARDIAC) 20 MG/ML IV SOLN
INTRAVENOUS | Status: AC
  Filled 2016-06-16: qty 5

## 2016-06-16 MED ORDER — ONDANSETRON HCL 4 MG/2ML IJ SOLN: 4.0000 mg | Freq: Once | INTRAMUSCULAR | Status: DC | PRN

## 2016-06-16 MED ORDER — LACTATED RINGERS IV SOLN
INTRAVENOUS | Status: DC
  Administered 2016-06-16: 12:00:00 via INTRAVENOUS

## 2016-06-16 MED ORDER — FENTANYL CITRATE (PF) 100 MCG/2ML IJ SOLN
INTRAMUSCULAR | Status: DC | PRN
  Administered 2016-06-16 (×2): 25 ug via INTRAVENOUS
  Administered 2016-06-16: 50 ug via INTRAVENOUS

## 2016-06-16 MED ORDER — MIDAZOLAM HCL 2 MG/2ML IJ SOLN
INTRAMUSCULAR | Status: AC
  Filled 2016-06-16: qty 2

## 2016-06-16 MED ORDER — PROPOFOL 10 MG/ML IV BOLUS
INTRAVENOUS | Status: DC | PRN
  Administered 2016-06-16: 100 mg via INTRAVENOUS

## 2016-06-16 MED ORDER — MEPERIDINE HCL 25 MG/ML IJ SOLN: 6.2500 mg | INTRAMUSCULAR | Status: DC | PRN

## 2016-06-16 MED ORDER — PROPOFOL 10 MG/ML IV BOLUS
INTRAVENOUS | Status: AC
  Filled 2016-06-16: qty 20

## 2016-06-16 MED ORDER — GLYCOPYRROLATE 0.2 MG/ML IJ SOLN
INTRAMUSCULAR | Status: DC | PRN
  Administered 2016-06-16: 0.2 mg via INTRAVENOUS

## 2016-06-16 MED ORDER — FENTANYL CITRATE (PF) 100 MCG/2ML IJ SOLN
INTRAMUSCULAR | Status: AC
  Filled 2016-06-16: qty 2

## 2016-06-16 MED ORDER — LIDOCAINE HCL (CARDIAC) 20 MG/ML IV SOLN
INTRAVENOUS | Status: DC | PRN
  Administered 2016-06-16: 100 mg via INTRAVENOUS

## 2016-06-16 MED ORDER — ONDANSETRON HCL 4 MG/2ML IJ SOLN
INTRAMUSCULAR | Status: DC | PRN
  Administered 2016-06-16: 4 mg via INTRAVENOUS

## 2016-06-16 MED ORDER — FENTANYL CITRATE (PF) 100 MCG/2ML IJ SOLN: 25.0000 ug | INTRAMUSCULAR | Status: DC | PRN

## 2016-06-16 MED ORDER — SCOPOLAMINE 1 MG/3DAYS TD PT72
MEDICATED_PATCH | TRANSDERMAL | Status: AC
  Administered 2016-06-16: 1.5 mg via TRANSDERMAL
  Filled 2016-06-16: qty 1

## 2016-06-16 MED ORDER — DEXAMETHASONE SODIUM PHOSPHATE 4 MG/ML IJ SOLN
INTRAMUSCULAR | Status: AC
  Filled 2016-06-16: qty 1

## 2016-06-16 MED ORDER — SCOPOLAMINE 1 MG/3DAYS TD PT72
1.0000 | MEDICATED_PATCH | Freq: Once | TRANSDERMAL | Status: DC
  Administered 2016-06-16: 1.5 mg via TRANSDERMAL

## 2016-06-16 MED ORDER — IBUPROFEN 600 MG PO TABS: 600.0000 mg | ORAL_TABLET | Freq: Four times a day (QID) | ORAL | 0 refills | Status: AC | PRN

## 2016-06-16 MED ORDER — KETOROLAC TROMETHAMINE 30 MG/ML IJ SOLN
INTRAMUSCULAR | Status: DC | PRN
  Administered 2016-06-16: 30 mg via INTRAVENOUS

## 2016-06-16 SURGICAL SUPPLY — 14 items
CLOTH BEACON ORANGE TIMEOUT ST (SAFETY) ×3 IMPLANT
GLOVE BIO SURGEON STRL SZ 6.5 (GLOVE) ×2 IMPLANT
GLOVE BIO SURGEONS STRL SZ 6.5 (GLOVE) ×1
GLOVE BIOGEL PI IND STRL 7.0 (GLOVE) ×1 IMPLANT
GLOVE BIOGEL PI INDICATOR 7.0 (GLOVE) ×2
GOWN STRL REUS W/TWL LRG LVL3 (GOWN DISPOSABLE) ×6 IMPLANT
NDL SPNL 22GX3.5 QUINCKE BK (NEEDLE) ×1 IMPLANT
NEEDLE SPNL 22GX3.5 QUINCKE BK (NEEDLE) IMPLANT
PACK VAGINAL MINOR WOMEN LF (CUSTOM PROCEDURE TRAY) ×3 IMPLANT
PAD PREP 24X48 CUFFED NSTRL (MISCELLANEOUS) ×3 IMPLANT
TOWEL OR 17X24 6PK STRL BLUE (TOWEL DISPOSABLE) ×4 IMPLANT
TUBING AQUILEX INFLOW (TUBING) ×2 IMPLANT
TUBING AQUILEX OUTFLOW (TUBING) ×2 IMPLANT
WATER STERILE IRR 1000ML POUR (IV SOLUTION) ×3 IMPLANT

## 2016-06-16 NOTE — Anesthesia Postprocedure Evaluation (Signed)
Anesthesia Post Note  Patient: Shelley Murray  Procedure(s) Performed: Procedure(s) (LRB): ,HYSTEROSCOPY  (N/A)  Patient location during evaluation: PACU Anesthesia Type: General Level of consciousness: awake and sedated Pain management: pain level controlled Vital Signs Assessment: post-procedure vital signs reviewed and stable Respiratory status: spontaneous breathing Cardiovascular status: stable Postop Assessment: no signs of nausea or vomiting Anesthetic complications: no     Last Vitals:  Vitals:   06/16/16 1330 06/16/16 1345  BP: 108/67 109/60  Pulse: 74 62  Resp: 14 14  Temp:      Last Pain:  Vitals:   06/16/16 1157  TempSrc: Oral   Pain Goal: Patients Stated Pain Goal: 3 (06/16/16 1157)               Kalsey Lull JR,JOHN Susann GivensFRANKLIN

## 2016-06-16 NOTE — Transfer of Care (Signed)
Immediate Anesthesia Transfer of Care Note  Patient: Shelley Murray  Procedure(s) Performed: Procedure(s): ,HYSTEROSCOPY  (N/A)  Patient Location: PACU  Anesthesia Type:General  Level of Consciousness: awake, alert  and oriented  Airway & Oxygen Therapy: Patient Spontanous Breathing and Patient connected to nasal cannula oxygen  Post-op Assessment: Report given to RN, Post -op Vital signs reviewed and stable and Patient moving all extremities X 4  Post vital signs: Reviewed and stable  Last Vitals:  Vitals:   06/16/16 1157 06/16/16 1321  BP: 102/70 113/77  Pulse: 63 73  Resp: 16 16  Temp: 36.8 C 36.7 C    Last Pain:  Vitals:   06/16/16 1157  TempSrc: Oral      Patients Stated Pain Goal: 3 (06/16/16 1157)  Complications: No apparent anesthesia complications

## 2016-06-16 NOTE — Anesthesia Preprocedure Evaluation (Signed)
Anesthesia Evaluation  Patient identified by MRN, date of birth, ID band Patient awake    Reviewed: Allergy & Precautions, H&P , NPO status , Patient's Chart, lab work & pertinent test results  Airway Mallampati: I  TM Distance: >3 FB Neck ROM: full    Dental no notable dental hx. (+) Teeth Intact   Pulmonary neg pulmonary ROS,    Pulmonary exam normal        Cardiovascular hypertension, Normal cardiovascular exam     Neuro/Psych negative neurological ROS  negative psych ROS   GI/Hepatic negative GI ROS, Neg liver ROS,   Endo/Other  negative endocrine ROS  Renal/GU negative Renal ROS     Musculoskeletal   Abdominal Normal abdominal exam  (+)   Peds  Hematology negative hematology ROS (+)   Anesthesia Other Findings   Reproductive/Obstetrics negative OB ROS                             Anesthesia Physical Anesthesia Plan  ASA: I  Anesthesia Plan: General   Post-op Pain Management:    Induction: Intravenous  Airway Management Planned: LMA  Additional Equipment:   Intra-op Plan:   Post-operative Plan:   Informed Consent: I have reviewed the patients History and Physical, chart, labs and discussed the procedure including the risks, benefits and alternatives for the proposed anesthesia with the patient or authorized representative who has indicated his/her understanding and acceptance.     Plan Discussed with: CRNA and Surgeon  Anesthesia Plan Comments:         Anesthesia Quick Evaluation

## 2016-06-16 NOTE — Anesthesia Procedure Notes (Signed)
Procedure Name: LMA Insertion Date/Time: 06/16/2016 12:35 PM Performed by: Leilani AbleHATCHETT, FRANKLIN Pre-anesthesia Checklist: Patient identified, Emergency Drugs available, Suction available and Patient being monitored Patient Re-evaluated:Patient Re-evaluated prior to inductionOxygen Delivery Method: Circle system utilized Preoxygenation: Pre-oxygenation with 100% oxygen Intubation Type: IV induction LMA: LMA inserted LMA Size: 4.0 Number of attempts: 1 Placement Confirmation: breath sounds checked- equal and bilateral and positive ETCO2 Tube secured with: Tape Dental Injury: Teeth and Oropharynx as per pre-operative assessment

## 2016-06-16 NOTE — Discharge Instructions (Signed)
Post Anesthesia Home Care Instructions  Activity: Get plenty of rest for the remainder of the day. A responsible adult should stay with you for 24 hours following the procedure.  For the next 24 hours, DO NOT: -Drive a car -Advertising copywriterperate machinery -Drink alcoholic beverages -Take any medication unless instructed by your physician -Make any legal decisions or sign important papers.  Meals: Start with liquid foods such as gelatin or soup. Progress to regular foods as tolerated. Avoid greasy, spicy, heavy foods. If nausea and/or vomiting occur, drink only clear liquids until the nausea and/or vomiting subsides. Call your physician if vomiting continues.  Special Instructions/Symptoms: Your throat may feel dry or sore from the anesthesia or the breathing tube placed in your throat during surgery. If this causes discomfort, gargle with warm salt water. The discomfort should disappear within 24 hours.  If you had a scopolamine patch placed behind your ear for the management of post- operative nausea and/or vomiting:  1. The medication in the patch is effective for 72 hours, after which it should be removed.  Wrap patch in a tissue and discard in the trash. Wash hands thoroughly with soap and water. 2. You may remove the patch earlier than 72 hours if you experience unpleasant side effects which may include dry mouth, dizziness or visual disturbances. 3. Avoid touching the patch. Wash your hands with soap and water after contact with the patch.   Histeroscopa - Cuidados posteriores (Hysteroscopy, Care After) Siga estas instrucciones durante las prximas semanas. Estas indicaciones le proporcionan informacin general acerca de cmo deber cuidarse despus del procedimiento. El mdico tambin podr darle instrucciones ms especficas. El tratamiento se ha planificado de acuerdo a las prcticas mdicas actuales, pero a veces se producen problemas. Comunquese con el mdico si tiene algn problema o tiene  dudas despus del procedimiento.  QU ESPERAR DESPUS DEL PROCEDIMIENTO Despus del procedimiento, es tpico tener las siguientes sensaciones:  Podr sentir clicos leves. Es normal que esto dure un par Kinder Morgan Energyde das.  Aumenta el sangrado. Puede variar desde un sangrado ligero durante un par de Countrywide Financialdas hasta un sangrado similar al menstrual por 3 - 7 das. INSTRUCCIONES PARA EL CUIDADO EN EL HOGAR  Descanse durante los primeros 1-2 das despus del procedimiento.  Tome slo medicamentos de venta libre o recetados, segn las indicaciones del mdico. No tome aspirina. La aspirina puede aumentar el riesgo de sangrado.  Tome slo duchas y no baos durante 2 semanas, segn las indicaciones de su mdico.  No conduzca por 24 horas o siga las indicaciones de su mdico.  No beba alcohol si toma analgsicos.  No utilice tampones, duchas vaginales ni tenga relaciones sexuales durante 2 semanas o hasta que el profesional la autorice.  Tmese la Beazer Homestemperatura dos veces por da, durante 4  [address] das. Antela cada vez.  Siga las indicaciones de su mdico acerca de la dieta, la actividad fsica y levantar objetos pesados.  Si est constipada, usted puede:  Tomar un laxante suave si su mdico la autoriza.  Agregar salvado a su dieta.  Beba suficiente lquido para Photographermantener la orina clara o de color amarillo plido.  Pdale a alguna persona que permanezca con usted durante las primeras 24 a 48 horas, especialmente si le han administrado anestesia general.  Concurra a las consultas de control con su mdico segn las indicaciones. SOLICITE ATENCIN MDICA SI:  Se siente mareado o sufre un desmayo.  Tiene Programme researcher, broadcasting/film/videomalestar estomacal (nuseas).  Tiene flujo vaginal anormal.  Tiene una erupcin.  Lenora BoysAumenta  el dolor y no puede controlarlo con Tourist information centre managerla medicacin. SOLICITE ATENCIN MDICA DE INMEDIATO SI:  Tiene cogulos de sangre o una hemorragia ms abundante que un perodo menstrual normal.  Tiene fiebre.  Aumenta  el dolor y no puede controlarlo con Tourist information centre managerla medicacin.  Siente un dolor nuevo en el vientre (abdominal).  Se desmaya.  Tiene dolor en los hombros (en la zona donde van los breteles).  Le falta el aire.   Esta informacin no tiene Theme park managercomo fin reemplazar el consejo del mdico. Asegrese de hacerle al mdico cualquier pregunta que tenga.   Document Released: 05/11/2013 Elsevier Interactive Patient Education [employer]2016 Elsevier Inc.

## 2016-06-16 NOTE — H&P (Signed)
  HPI Shelley Murray is a 35 y.o. female.  [id number] Patient's last menstrual period was 05/24/2016 (exact date). IUD was removed at the HD 01/2016 but one arm was retained as demonstrated by US. She has some pain worse with menses. She comes today for removal in the OR with hysteroscopy. HPI       Past Medical History  Diagnosis Date  . Preeclampsia 2007    during pregnancy         Past Surgical History  Procedure Laterality Date  . Mirena  11/07/10         Family History  Problem Relation Age of Onset  . Diabetes Mother   . Heart disease Mother   . Asthma Son   . Breast cancer Maternal Grandmother     Social History       Social History  Substance Use Topics  . Smoking status: Never Smoker   . Smokeless tobacco: Never Used  . Alcohol Use: Yes     Comment: 2 drinks per year    No Known Allergies        Current Outpatient Prescriptions  Medication Sig Dispense Refill  . levonorgestrel (MIRENA) 20 MCG/24HR IUD 1 each by Intrauterine route once. Inserted 11/07/10     . medroxyPROGESTERone (DEPO-PROVERA) 150 MG/ML injection Inject 150 mg into the muscle every 3 (three) months.    Marland Kitchen. levonorgestrel (MIRENA) 20 MCG/24HR IUD 1 each by Intrauterine route once. 1 each 0   No current facility-administered medications for this visit.    Review of Systems Review of Systems  Constitutional: Negative.   Gastrointestinal: Negative.   Genitourinary: Positive for pelvic pain. Negative for vaginal discharge and menstrual problem.    Blood pressure 102/70, pulse 63, temperature 98.3 F (36.8 C), temperature source Oral, resp. rate 16, last menstrual period 05/24/2016, SpO2 100 %.   Physical Exam Physical Exam  Constitutional: She is oriented to person, place, and time. She appears well-developed.  Genitourinary: Vagina normal. No vaginal discharge found.   Neurological: She is alert and oriented to person, place, and time.  Psychiatric:  She has a normal mood and affect. Her behavior is normal.    Data Reviewed US result shows foreign body in LUS  Assessment    Retained portion of IUD      Plan   Patient desires surgical management with hysteroscopy and removal of retained IUD fragment.  The risks of surgery were discussed in detail with the patient including but not limited to: bleeding which may require transfusion or reoperation; infection which may require prolonged hospitalization or re-hospitalization and antibiotic therapy; injury to bowel, bladder, ureters and major vessels or other surrounding organs; need for additional procedures including laparotomy; thromboembolic phenomenon, incisional problems and other postoperative or anesthesia complications.  Patient was told that the likelihood that her condition and symptoms will be treated effectively with this surgical management was very high; the postoperative expectations were also discussed in detail. The patient also understands the alternative treatment options which were discussed in full. All questions were answered.   Adam PhenixJames G Santiago Stenzel, MD 06/16/2016 12:18 PM

## 2016-06-16 NOTE — Op Note (Signed)
PREOPERATIVE DIAGNOSIS:  Retained fragment of IUD after removal of device 01/2016 POSTOPERATIVE DIAGNOSIS: normal endometrial cavity no foreign body seen PROCEDURE: Hysteroscopy SURGEON:  Dr. Scheryl DarterJames Gianmarco Roye   INDICATIONS: 35 y.o. [id number]  here for scheduled surgery for the aforementioned diagnoses.   Risks of surgery were discussed with the patient including but not limited to: bleeding which may require transfusion; infection which may require antibiotics; injury to uterus or surrounding organs; intrauterine scarring which may impair future fertility; need for additional procedures including laparotomy or laparoscopy; and other postoperative/anesthesia complications. Written informed consent was obtained.    FINDINGS:  A 6 week size uterus.  Diffuse proliferative endometrium.  Normal ostia bilaterally.  ANESTHESIA:   General INTRAVENOUS FLUIDS:  1000 ml of LR FLUID DEFICITS:  Minimal  ESTIMATED BLOOD LOSS:  Less than 20 ml COMPLICATIONS:  None immediate.  PROCEDURE DETAILS:  She was taken to the operating room where general anesthesia was administered and was found to be adequate.  After an adequate timeout was performed, she was placed in the dorsal lithotomy position and examined; then prepped and draped in the sterile manner.   Her bladder was catheterized for an unmeasured amount of clear, yellow urine. A speculum was then placed in the patient's vagina and a single tooth tenaculum was applied to the anterior lip of the cervix.  The cervix was sounded to 8 cm and dilated manually with metal dilators to accommodate the 5 mm diagnostic hysteroscope.  Once the cervix was dilated, the hysteroscope was inserted under direct visualization using saline as a suspension medium.  The uterine cavity was carefully examined with the findings as noted above.   After further careful visualization of the uterine cavity, the hysteroscope was removed under direct visualization.   The tenaculum was removed from the  anterior lip of the cervix and the vaginal speculum was removed after noting good hemostasis.  The patient tolerated the procedure well and was taken to the recovery area awake, extubated and in stable condition.  The patient will be discharged to home as per PACU criteria.  Routine postoperative instructions given.  She was prescribed  Ibuprofen .  She will follow up in the clinic in 2-4 weeks  for postoperative evaluation. US of the pelvis will be repeated.  Adam PhenixJames G Janna Oak, MD 06/16/2016 1:22 PM

## 2016-06-17 ENCOUNTER — Encounter (HOSPITAL_COMMUNITY): Payer: Self-pay | Admitting: Obstetrics & Gynecology

## 2016-06-23 ENCOUNTER — Ambulatory Visit (HOSPITAL_COMMUNITY)
Admission: RE | Admit: 2016-06-23 | Discharge: 2016-06-23 | Disposition: A | Discharge status: Home / Self Care | Payer: Self-pay | Source: Ambulatory Visit | Attending: Obstetrics & Gynecology | Admitting: Obstetrics & Gynecology

## 2016-06-23 DIAGNOSIS — Z975 Presence of (intrauterine) contraceptive device: Secondary | ICD-10-CM

## 2016-07-02 ENCOUNTER — Telehealth: Payer: Self-pay | Admitting: *Deleted

## 2016-07-02 NOTE — Telephone Encounter (Signed)
Pt called and requested results of US on 11/20. With aid from interpreter Lorinda Creedaquel Mora, pt was informed of US showing no IUD or fragment of IUD in the uterus.  Pt wants to know what to do next. I advised that per Dr. Olivia MackieArnold's operative note, he wanted to see her in 2-4 weeks after her surgical procedure on 11/13. Pt stated that she is overwhelmed with bills from her medical care and requested to receive information by phone if possible.  I stated that I will speak with Dr. Debroah LoopArnold on 12/1 and call her back that day or early next week. Pt voiced understanding.

## 2016-07-04 ENCOUNTER — Encounter: Payer: Self-pay | Admitting: Obstetrics & Gynecology

## 2016-07-15 NOTE — Telephone Encounter (Signed)
Spoke with Alanda AmassBeronica, who is going to call patient and schedule a post op appt and discuss importance of coming in for follow up

## 2016-07-16 NOTE — Telephone Encounter (Addendum)
I consulted with Dr. Debroah LoopArnold on 12/4.  He confirmed that there was no remaining fragment of the IUD present at the time of surgery.  It appears that the fragment had passed spontaneously. Pt does not require follow up appt if she is not having any problems. I called pt with [facility] interpreter Madaline GuthrieFernando # [phone] and informed pt of Dr. Olivia MackieArnold's response. She stated that she had bleeding x3 weeks like a period which was heavy. Now the bleeding is light. She also is still having some discomfort/pain. I advised to pt that she really needs to have follow up in the office for evaluation and we cannot prescribe any pain medication without an evaluation.  She stated that she is taking ibuprofen with good results and absolutely cannot cone in for appt due to the expense. I stated that I will consult with Dr. Debroah LoopArnold again and call her back with updated recommendations. If she develops heavy bleeding or severe pain she needs to have evaluation @ [facility]. Pt voiced understanding.

## 2016-10-24 ENCOUNTER — Ambulatory Visit (INDEPENDENT_AMBULATORY_CARE_PROVIDER_SITE_OTHER): Payer: Self-pay | Admitting: Family Medicine

## 2016-10-24 VITALS — BP 108/60 | HR 62 | Temp 98.0°F | Ht 61.5 in | Wt 125.8 lb

## 2016-10-24 DIAGNOSIS — Z7689 Persons encountering health services in other specified circumstances: Secondary | ICD-10-CM

## 2016-10-24 DIAGNOSIS — Z3046 Encounter for surveillance of implantable subdermal contraceptive: Secondary | ICD-10-CM

## 2016-10-24 NOTE — Progress Notes (Signed)
In-person [employer] interpreter utilized during today's visit; name Shelley Murray.   [facility] Visit  HPI:  Patient presents to [facility] today for a new patient appointment to establish general primary care, also to discuss:  Irregular Bleeding - Patient states that she had a Nexplanon placed at the health department in September of last year. She states that since then she has had irregular bleeding. She continues to have normal periods along with spotting in between. States that she can take Tylenol to help stop bleeding. Nexplanon located in her Left arm. No symptomatic anemia; denies cold intolerance, pica, palpitations, fatigue.   ROS: See HPI  Immigrant Social History: - Date arrived in [place]: 15 years ago - Country of origin: [place] - Primary language: Spanish but understands some English - Education: Highest level of education: 5th grade - Prior work: housekeeping - Designer, fashion/clothingTobacco/alcohol/drug use: drinks occasional beer - Marriage Status: no has a long time significant other - Sexual activity: yes  Preventative Care History: -Seen at health department?: yes  Past Medical Hx:  -None  Past Surgical Hx:  -None  Family Hx: updated in Epic  PHYSICAL EXAM: BP 108/60   Pulse 62   Temp 98 F (36.7 C) (Oral)   Ht 5' 1.5" (1.562 m)   Wt 125 lb 12.8 oz (57.1 kg)   LMP 10/05/2016 (Approximate)   SpO2 98%   BMI 23.38 kg/m  General: alert, well-developed, NAD, cooperative HEENT: NCAT, vision grossly intact, PERRLA, no injection and anicteric. EOMI. MMM, oral mucosa and oropharynx reveal no lesions or exudates.  Ears: External ear exam reveals no significant lesions or deformities. Canals normal. TMs normal. Hearing is grossly normal bilaterally. Nose: External nasal exam reveals no deformity or inflammation. Nasal mucosa are pink and moist. No lesions or exudates noted.   Neck: supple, full ROM, no thyromegaly. No deformities, masses, or tenderness noted.  Lungs: CTAB,  normal respiratory effort, no accessory muscle use, no crackles, and no wheezes.  Heart: RRR, no M/R/G. Pulses intact. Abdomen: Bowel sounds normal; abdomen soft and nontender. No masses, organomegaly or hernias noted. no guarding, no rebound tenderness Msk: no joint swelling, no joint warmth, and no redness over joints. Range of motion normal.Tone & strength normal. Extremities: No cyanosis, clubbing, edema Neurologic: No focal deficits, CN grossly intact,+5 strength globally, sensation grossly intact, gait normal, A&Ox3. Deep tendon reflexes symmetrical and normal. Skin: Intact without suspicious lesions or rashes. Warm and dry. Psych: Mood and affect are normal; no evidence of anxiety or depression.  Examined and interviewed with Dr. Gwendolyn GrantWalden  FOLLOW UP: F/u in 6 weeks   Caryl AdaJazma Phelps, DO 10/24/2016, 4:12 PM PGY-3, [facility]

## 2016-10-24 NOTE — Patient Instructions (Signed)
Etonogestrel implant Qu es este medicamento? El ETONOGESTREL es un dispositivo anticonceptivo (control de la natalidad). Se usa para evitar los embarazos. Se puede usar hasta por 3 aos. Este medicamento puede ser utilizado para otros usos; si tiene alguna pregunta consulte con su proveedor de atencin mdica o con su farmacutico. MARCAS COMUNES: Implanon, Nexplanon Qu le debo informar a mi profesional de la salud antes de tomar este medicamento? Necesita saber si usted presenta alguno de los siguientes problemas o situaciones: sangrado vaginal anormal enfermedad vascular o cogulos sanguneos cncer de mama, cervical, heptico depresin diabetes enfermedad de la vescula biliar dolores de cabeza enfermedad cardiaca o ataque cardiaco reciente alta presin sangunea alto nivel de colesterol enfermedad renal enfermedad heptica convulsiones fuma tabaco una reaccin alrgica o inusual al etonogestrel, otras hormonas, anestsicos o antispticos, medicamentos, alimentos, colorantes o conservantes si est embarazada o buscando quedar embarazada si est amamantando a un beb Cmo debo utilizar este medicamento? Un profesional de la salud inserta este dispositivo justo debajo de la piel en la parte interior del brazo. Hable con su pediatra para informarse acerca del uso de este medicamento en nios. Puede requerir atencin especial. Sobredosis: Pngase en contacto inmediatamente con un centro toxicolgico o una sala de urgencia si usted cree que haya tomado demasiado medicamento. ATENCIN: Este medicamento es solo para usted. No comparta este medicamento con nadie. Qu sucede si me olvido de una dosis? No se aplica en este caso. Qu puede interactuar con este medicamento? No tome este medicamento con ninguno de los siguientes frmacos: amprenavir bosentano fosamprenavir Este medicamento tambin podra interactuar con los siguientes medicamentos: barbitricos para inducir el sueo o para el  tratamiento de convulsiones ciertos medicamentos para infecciones micticas, tales como itraconazol y ketoconazol jugo de toronja griseofulvina medicamentos para tratar convulsiones, tales como carbamazepina, felbamato, oxcarbazepina, fenitona, topiramato modafinilo fenilbutazona rifampicina rufinamida algunos medicamentos para tratar la infeccin por el VIH, tales como atazanavir, indinavir, lopinavir, nelfinavir, tipranavir, ritonavir hierba de [place] Puede ser que esta lista no menciona todas las posibles interacciones. Informe a su profesional de la salud de todos los productos a base de hierbas, medicamentos de venta libre o suplementos nutritivos que est tomando. Si usted fuma, consume bebidas alcohlicas o si utiliza drogas ilegales, indqueselo tambin a su profesional de la salud. Algunas sustancias pueden interactuar con su medicamento. A qu debo estar atento al usar este medicamento? Este producto no protege contra la infeccin por el VIH (SIDA) u otras enfermedades de transmisin sexual. Usted debe sentir el implante al presionar con las yemas de los dedos sobre la piel donde se insert. Contacte a su mdico si no se siente el implante y usa un mtodo anticonceptivo no hormonal (como el condn) hasta que el mdico confirma que el implante est en su lugar. Si siente que el implante puede haber roto o doblado en su brazo, pngase en contacto con su proveedor de atencin mdica. Qu efectos secundarios puedo tener al utilizar este medicamento? Efectos secundarios que debe informar a su mdico o a su profesional de la salud tan pronto como sea posible: reacciones alrgicas, como erupcin cutnea, picazn o urticarias, e hinchazn de la cara, los labios o la lengua bultos en las mamas cambios en las emociones o el estado de nimo estado de nimo deprimido sangrado menstrual intenso o prolongado dolor, irritacin, hinchazn o moretones en el lugar de la insercin cicatriz en el lugar de la  insercin signos de infeccin en el sitio de insercin tales como fiebre, y enrojecimiento de   la piel, dolor o secrecin signos de embarazo signos y sntomas de un cogulo sanguneo, tales como problemas respiratorios; cambios en la visin; dolor en el pecho; dolor de cabeza severo, repentino; dolor, hinchazn, calor en la pierna; dificultad para hablar; entumecimiento o debilidad repentina de la cara, el brazo o la pierna signos y sntomas de lesin al hgado, como orina amarilla oscura o marrn; sensacin general de estar enfermo o sntomas gripales; heces claras; prdida de apetito; nuseas; dolor en la regin abdominal superior derecha; cansancio o debilidad inusual; color amarillento de los ojos o la piel sangrado vaginal inusual, secrecin signos y sntomas de un derrame cerebral, tales como cambios en la visin; confusin; dificultad para hablar o entender; dolores de cabeza severos; entumecimiento o debilidad repentina de la cara, el brazo o la pierna; problemas al caminar; mareo; prdida del equilibrio o la coordinacin Efectos secundarios que generalmente no requieren atencin mdica (infrmelos a su mdico o a su profesional de la salud si persisten o si son molestos): acn dolor de espalda dolor en las mamas cambios de peso mareos sensacin general de estar enfermo o sntomas gripales dolor de cabeza sangrado menstrual irregular nuseas dolor de garganta irritacin o inflamacin vaginal Puede ser que esta lista no menciona todos los posibles efectos secundarios. Comunquese a su mdico por asesoramiento mdico sobre los efectos secundarios. Usted puede informar los efectos secundarios a la FDA por telfono al 1-800-FDA-1088. Dnde debo guardar mi medicina? Este medicamento se administra en hospitales o clnicas y no necesitar guardarlo en su domicilio. ATENCIN: Este folleto es un resumen. Puede ser que no cubra toda la posible informacin. Si usted tiene preguntas acerca de esta medicina,  consulte con su mdico, su farmacutico o su profesional de la salud.  2018 Elsevier/Gold Standard (2016-08-21 00:00:00)  

## 2016-10-26 ENCOUNTER — Encounter: Payer: Self-pay | Admitting: Family Medicine

## 2016-10-26 DIAGNOSIS — Z309 Encounter for contraceptive management, unspecified: Secondary | ICD-10-CM | POA: Insufficient documentation

## 2016-10-26 NOTE — Assessment & Plan Note (Signed)
Patient has Nexplanon that was placed approximately 6 months ago. Discussed with patient that it is common to have irregular bleeding with Nexplanon in first 6 months to a year as body is adjusting to hormones. Patient counseled. Discussed we will continue to monitor and at next visit check for anemia.

## 2016-10-27 ENCOUNTER — Ambulatory Visit: Payer: Self-pay

## 2016-12-04 ENCOUNTER — Ambulatory Visit: Payer: Self-pay | Admitting: Internal Medicine

## 2016-12-16 ENCOUNTER — Ambulatory Visit: Payer: Self-pay | Admitting: Family Medicine

## 2016-12-16 ENCOUNTER — Ambulatory Visit (INDEPENDENT_AMBULATORY_CARE_PROVIDER_SITE_OTHER): Payer: Self-pay | Admitting: Family Medicine

## 2016-12-16 ENCOUNTER — Encounter: Payer: Self-pay | Admitting: Family Medicine

## 2016-12-16 VITALS — BP 99/60 | HR 76 | Temp 98.1°F | Ht 62.0 in | Wt 126.6 lb

## 2016-12-16 DIAGNOSIS — L237 Allergic contact dermatitis due to plants, except food: Secondary | ICD-10-CM

## 2016-12-16 DIAGNOSIS — M25562 Pain in left knee: Secondary | ICD-10-CM

## 2016-12-16 MED ORDER — TRIAMCINOLONE ACETONIDE 0.1 % EX OINT: 1.0000 "application " | TOPICAL_OINTMENT | Freq: Two times a day (BID) | CUTANEOUS | 2 refills | Status: AC

## 2016-12-16 NOTE — Progress Notes (Signed)
    Subjective:    Patient ID: Shelley Murray, female    DOB: 07-12-1981, 36 y.o.   MRN: [id number]   CC: poison ivy and knee pain  Poison ivy- on her right wrist up her right arm for the past week. It is no where else on her body. She has used an ointment on the area with no relief. The ointment was for her son. She is unsure what the ointment was.   Knee pain Left knee hurts when she bends or moves it. Has been there for 3 weeks, first time this knee has hurt. Denies injury or trauma to the knee. 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   Smoking status reviewed- non-smoker  Review of Systems- see HPI  Objective:  BP 99/60   Pulse 76   Temp 98.1 F (36.7 C) (Oral)   Ht 5\' 2"  (1.575 m)   Wt 126 lb 9.6 oz (57.4 kg)   LMP 12/02/2016 (Approximate)   SpO2 99%   BMI 23.16 kg/m  Vitals and nursing note reviewed  General: well nourished, in no acute distress MSK Extremities: no edema or cyanosis. Warm, well perfused. 2+ radial and PT pulses bilaterally Skin: warm and dry. Vesicular rash present on right wrist extending up forearm. Mildly erythematous. Neuro: alert and oriented, no focal deficits   Assessment & Plan:    Acute pain of left knee  Acute, likely due to overuse from work. No injury or trauma to area. Patient without insurance would not like to get x-ray at this time.  -advised Aleve as needed -follow up if pain persists, can consider knee imaging in future  Poison ivy dermatitis  Acute, occurred a week ago, mild   -rx given for triamcinolone ointment to use BID- [facility]  -advised patient to use benadryl at night to help with itching -follow up if worsens  Need for vaccinations -orange card does not cover vaccines and financial assistance not finalized yet -recommended patient go to [facility] to get vaccines done at affordable cost  Health maintenance -had pap last year, normal -due for flu shot in  fall  Return if symptoms worsen or fail to improve.   Dolores PattyAngela Seidy Labreck, DO Family Medicine Resident PGY-1

## 2016-12-16 NOTE — Assessment & Plan Note (Signed)
  Acute, occurred a week ago, mild   -rx given for triamcinolone ointment to use BID- [facility]  -advised patient to use benadryl at night to help with itching -follow up if worsens

## 2016-12-16 NOTE — Patient Instructions (Addendum)
  Please try Aleve for your knee pain.   If you have questions or concerns please do not hesitate to call at [phone].  Dolores PattyAngela Sahas Sluka, DO PGY-1, [facility] 12/16/2016 4:15 PM

## 2016-12-16 NOTE — Assessment & Plan Note (Signed)
  Acute, likely due to overuse from work. No injury or trauma to area. Patient without insurance would not like to get x-ray at this time.  -advised Aleve as needed -follow up if pain persists, can consider knee imaging in future

## 2017-02-23 IMAGING — US US PELVIS COMPLETE
1 series · 15 of 25 positions shown · non-contrast
Comparison: 01/07/2011

CLINICAL DATA: Question retained IUD arm

EXAM:
TRANSABDOMINAL AND TRANSVAGINAL ULTRASOUND OF PELVIS
TECHNIQUE: Both transabdominal and transvaginal ultrasound examinations of the
pelvis were performed. Transabdominal technique was performed for
global imaging of the pelvis including uterus, ovaries, adnexal
regions, and pelvic cul-de-sac. It was necessary to proceed with
endovaginal exam following the transabdominal exam to visualize the
endometrium and ovaries.

[Series 1: us pelvis complete · 38 acquisitions, 15 frames shown]
[im 1/38]
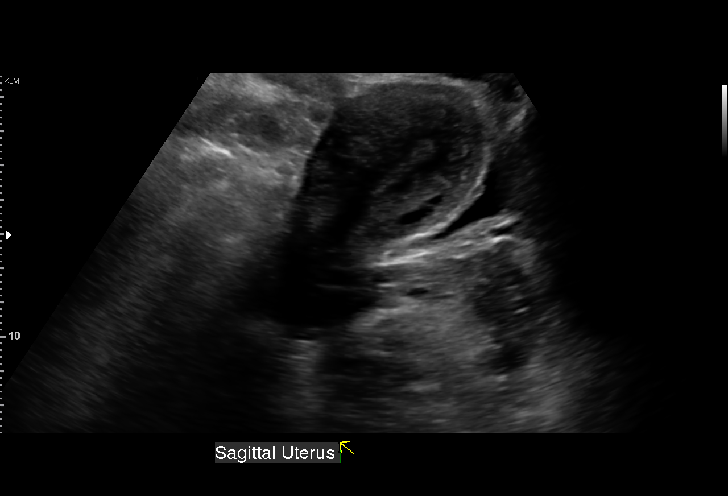
[im 4/38]
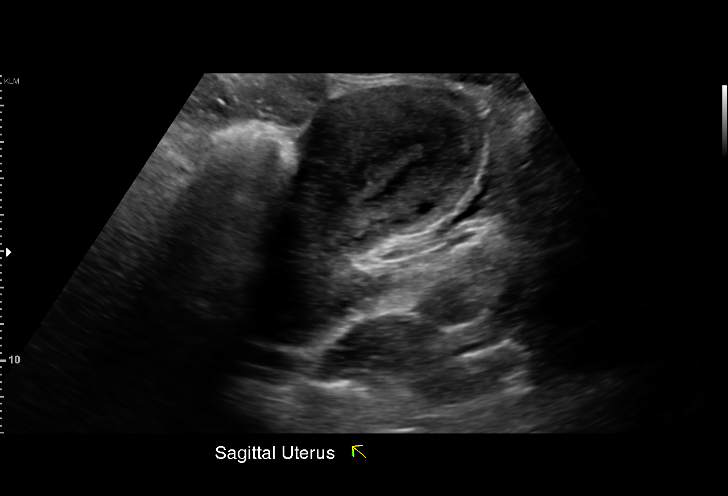
[im 7/38]
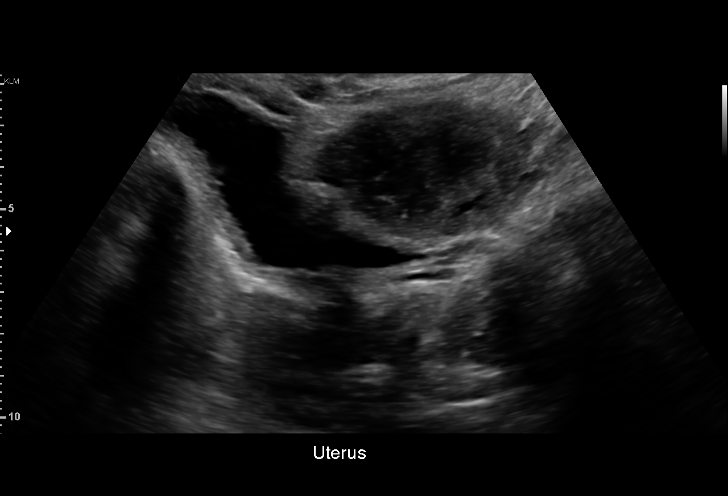
[im 8/38]
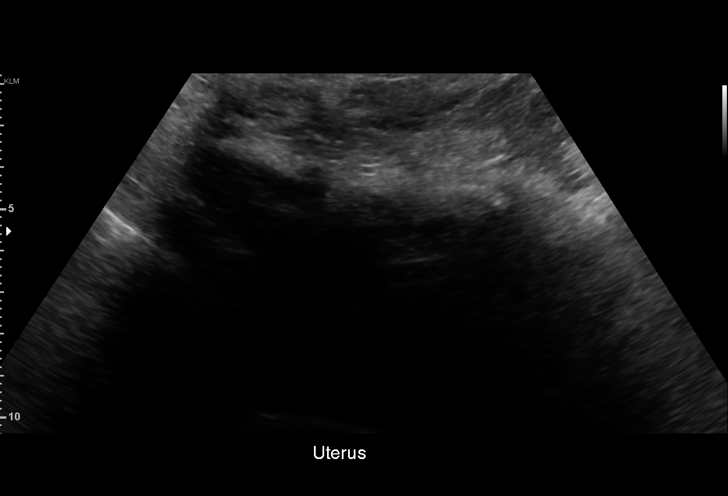
[im 11/38]
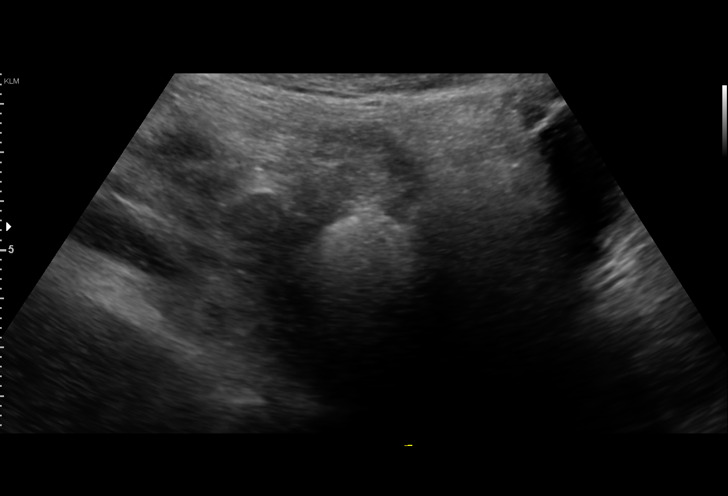
[im 14/38]
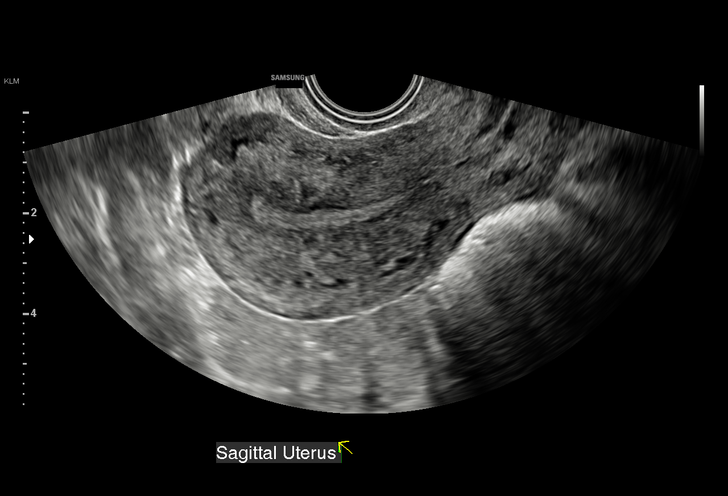
[im 16/38]
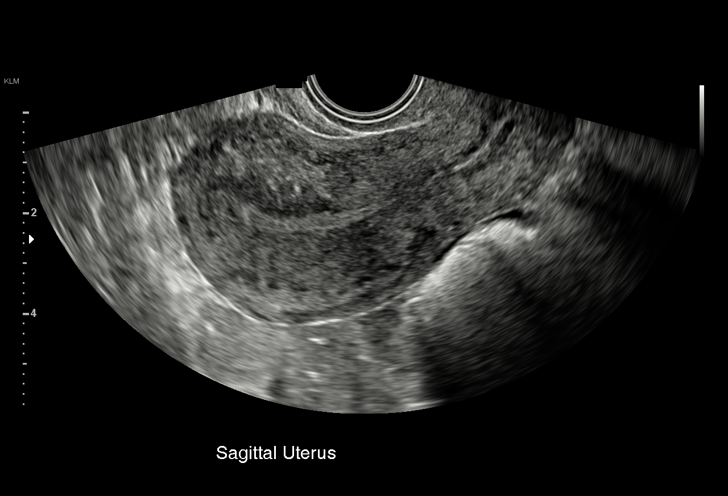
[im 19/38]
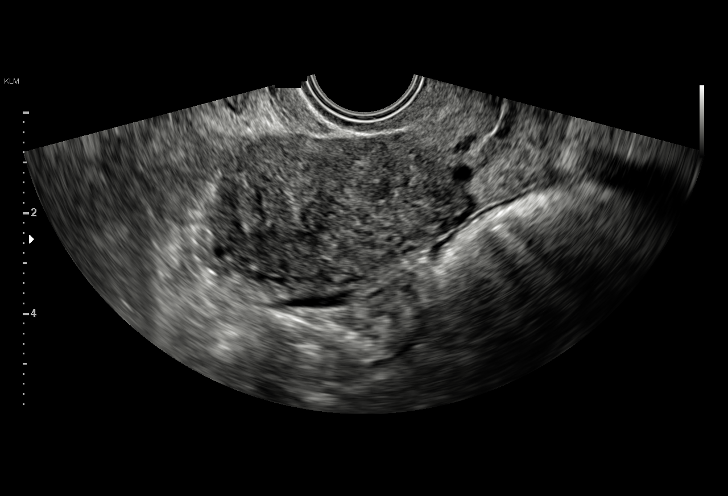
[im 22/38]
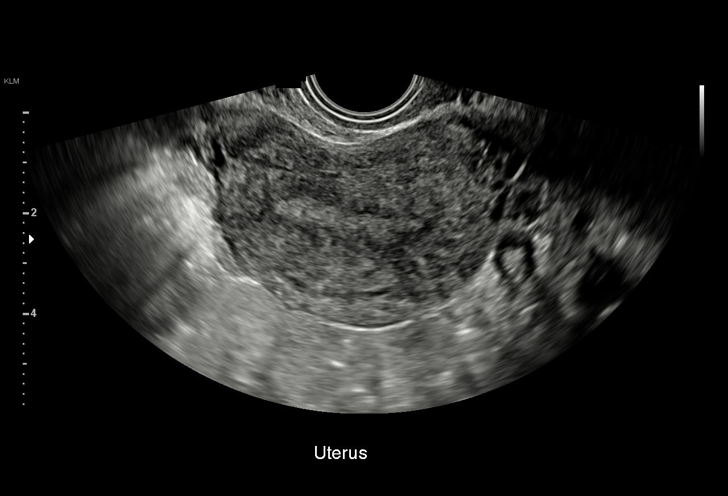
[im 24/38]
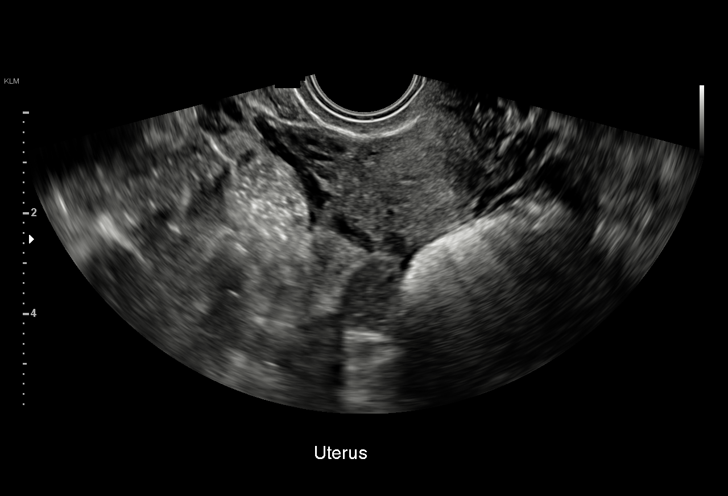
[im 27/38]
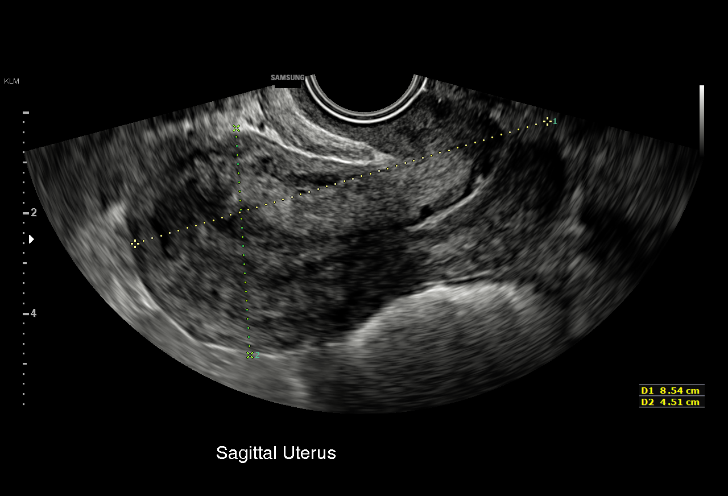
[im 30/38]
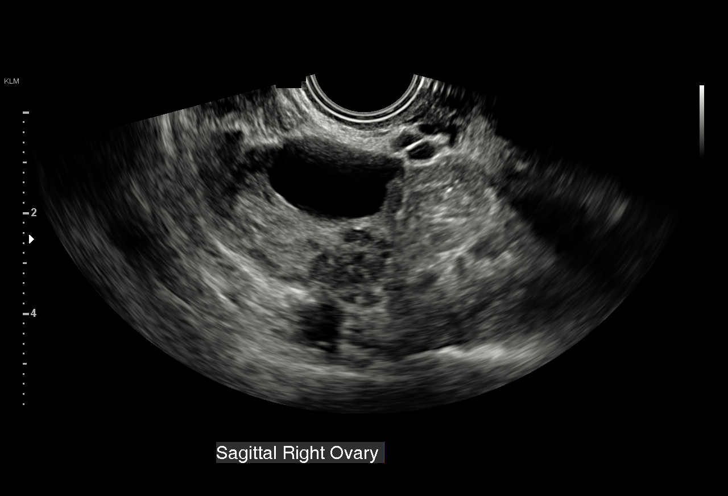
[im 31/38]
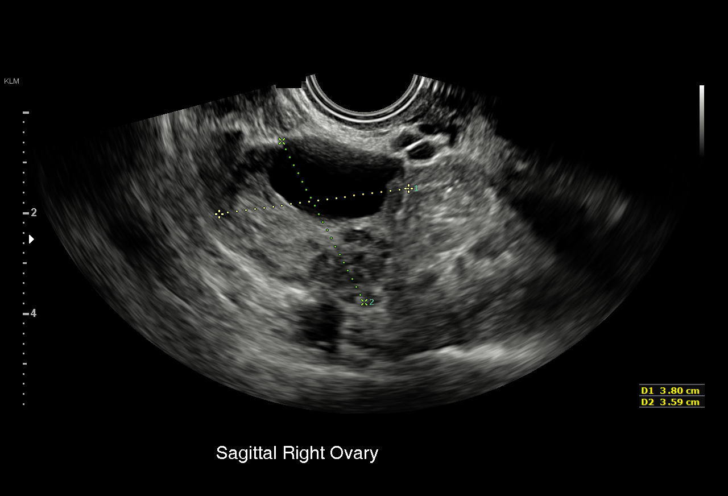
[im 34/38]
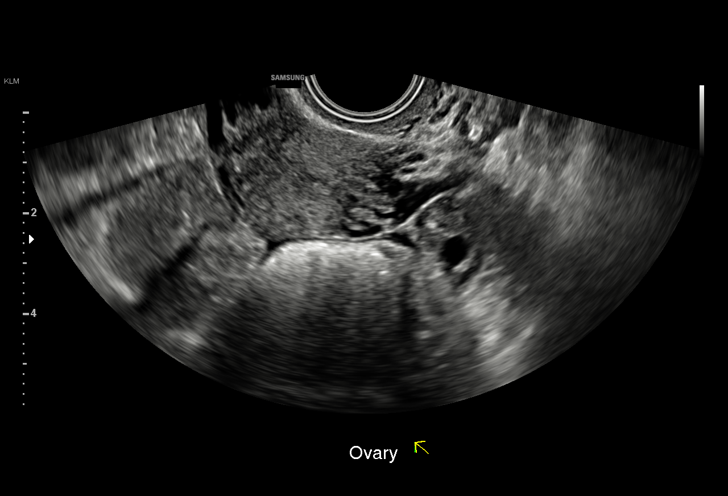
[im 38/38]
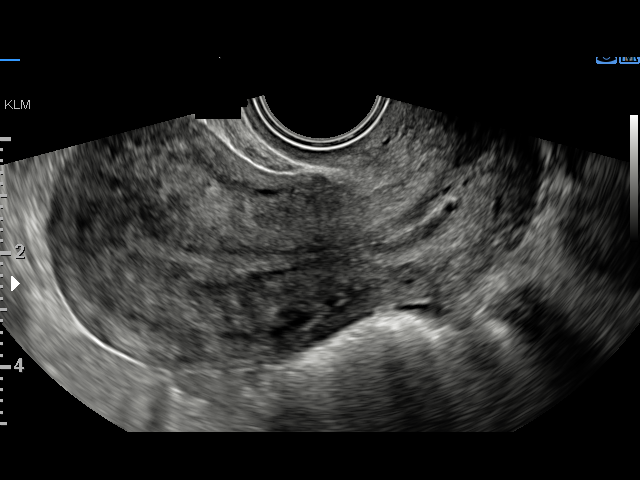

[15 of 25 positions shown; findings below may reference images not displayed]

FINDINGS: Uterus

Measurements: 8.5 x 4.5 x 5.6 cm. No fibroids or other mass
visualized.

Endometrium

Thickness: 5 mm in thickness. No focal abnormality. No visible IUD
or fragment noted.

Right ovary

Measurements: 3.8 x 3.6 x 3.9 cm. Normal appearance/no adnexal mass.

Left ovary

Measurements: 2.6 x 3.2 x 1.5 cm. Normal appearance/no adnexal mass.

Other findings

No abnormal free fluid.
IMPRESSION: No visible IUD or IUD fragment noted.  Unremarkable study.

## 2017-07-24 ENCOUNTER — Encounter (HOSPITAL_COMMUNITY): Payer: Self-pay

## 2019-10-21 ENCOUNTER — Ambulatory Visit: Payer: Self-pay | Attending: Internal Medicine

## 2019-10-21 DIAGNOSIS — Z23 Encounter for immunization: Secondary | ICD-10-CM

## 2019-10-21 NOTE — Progress Notes (Signed)
   Covid-19 Vaccination Clinic  Name:  Shelley Murray    MRN: [id number] DOB: 08-08-1980  10/21/2019  [name] was observed post Covid-19 immunization for 15 minutes without incident. She was provided with Vaccine Information Sheet and instruction to access the V-Safe system.   [name] was instructed to call 911 with any severe reactions post vaccine: Marland Kitchen Difficulty breathing  . Swelling of face and throat  . A fast heartbeat  . A bad rash all over body  . Dizziness and weakness   Immunizations Administered    Name Date Dose VIS Date Route   Pfizer COVID-19 Vaccine 10/21/2019  7:10 PM 0.3 mL 07/15/2019 Intramuscular   Manufacturer: [employer], [employer]   Lot: [id number]   NDC: [address]-3

## 2019-11-11 ENCOUNTER — Ambulatory Visit: Payer: Self-pay | Attending: Internal Medicine

## 2019-11-11 DIAGNOSIS — Z23 Encounter for immunization: Secondary | ICD-10-CM

## 2019-11-11 NOTE — Progress Notes (Signed)
   Covid-19 Vaccination Clinic  Name:  Shelley Murray    MRN: [id number] DOB: January 29, 1981  11/11/2019  [name] was observed post Covid-19 immunization for 15 minutes without incident. She was provided with Vaccine Information Sheet and instruction to access the V-Safe system. Medical Interpreter used.  [name] was instructed to call 911 with any severe reactions post vaccine: Marland Kitchen Difficulty breathing  . Swelling of face and throat  . A fast heartbeat  . A bad rash all over body  . Dizziness and weakness   Immunizations Administered    Name Date Dose VIS Date Route   Pfizer COVID-19 Vaccine 11/11/2019  7:04 PM 0.3 mL 07/15/2019 Intramuscular   Manufacturer: [employer], [employer]   Lot: [id number]   NDC: [address]-6

## 2023-02-04 ENCOUNTER — Encounter (HOSPITAL_COMMUNITY): Payer: Self-pay | Admitting: Emergency Medicine

## 2023-02-04 ENCOUNTER — Ambulatory Visit (HOSPITAL_COMMUNITY)
Admission: EM | Admit: 2023-02-04 | Discharge: 2023-02-04 | Disposition: A | Discharge status: Home / Self Care | Payer: Self-pay | Attending: Family Medicine | Admitting: Family Medicine

## 2023-02-04 DIAGNOSIS — T300 Burn of unspecified body region, unspecified degree: Secondary | ICD-10-CM

## 2023-02-04 MED ORDER — SILVER SULFADIAZINE 1 % EX CREA
TOPICAL_CREAM | Freq: Every day | CUTANEOUS | Status: AC
  Administered 2023-02-04: 1 via TOPICAL

## 2023-02-04 MED ORDER — SILVER SULFADIAZINE 1 % EX CREA
TOPICAL_CREAM | CUTANEOUS | Status: AC
  Filled 2023-02-04: qty 85

## 2023-02-04 MED ORDER — SILVER SULFADIAZINE 1 % EX CREA: 1.0000 | TOPICAL_CREAM | Freq: Every day | CUTANEOUS | 0 refills | Status: AC
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
## 2023-02-04 NOTE — Discharge Instructions (Addendum)
Continue amoxicillin for 7 days Continue silver sulfadiazine Please alternate tylenol and ibuprofen  We are referring  Return to care if having fevers, drainage, worsening p-ain

## 2023-03-04 ENCOUNTER — Ambulatory Visit (HOSPITAL_BASED_OUTPATIENT_CLINIC_OR_DEPARTMENT_OTHER): Payer: Self-pay | Admitting: General Surgery
